# Patient Record
Sex: Female | Born: 1965
Health system: Southern US, Community
[De-identification: ages and names within clinical notes are randomized; demographics above are authoritative.]

## PROBLEM LIST (undated history)

## (undated) DIAGNOSIS — M722 Plantar fascial fibromatosis: Secondary | ICD-10-CM

## (undated) DIAGNOSIS — N83209 Unspecified ovarian cyst, unspecified side: Secondary | ICD-10-CM

## (undated) DIAGNOSIS — N809 Endometriosis, unspecified: Secondary | ICD-10-CM

## (undated) HISTORY — DX: Endometriosis, unspecified: N80.9

## (undated) HISTORY — DX: Unspecified ovarian cyst, unspecified side: N83.209

## (undated) HISTORY — DX: Plantar fascial fibromatosis: M72.2

## (undated) HISTORY — PX: TUBAL LIGATION: SHX77

## (undated) HISTORY — PX: EYE SURGERY: SHX253

---

## 2002-05-14 ENCOUNTER — Ambulatory Visit (HOSPITAL_COMMUNITY): Admission: RE | Admit: 2002-05-14 | Discharge: 2002-05-14 | Payer: Self-pay | Admitting: Family Medicine

## 2002-05-14 ENCOUNTER — Encounter: Payer: Self-pay | Admitting: Family Medicine

## 2002-12-15 ENCOUNTER — Other Ambulatory Visit: Admission: RE | Admit: 2002-12-15 | Discharge: 2002-12-15 | Payer: Self-pay | Admitting: Family Medicine

## 2002-12-15 ENCOUNTER — Other Ambulatory Visit: Admission: RE | Admit: 2002-12-15 | Discharge: 2002-12-15 | Payer: Self-pay | Admitting: *Deleted

## 2003-01-04 ENCOUNTER — Other Ambulatory Visit: Admission: RE | Admit: 2003-01-04 | Discharge: 2003-01-04 | Payer: Self-pay | Admitting: Obstetrics & Gynecology

## 2003-01-22 ENCOUNTER — Ambulatory Visit (HOSPITAL_COMMUNITY): Admission: RE | Admit: 2003-01-22 | Discharge: 2003-01-22 | Payer: Self-pay | Admitting: Obstetrics & Gynecology

## 2007-02-10 ENCOUNTER — Other Ambulatory Visit: Admission: RE | Admit: 2007-02-10 | Discharge: 2007-02-10 | Payer: Self-pay | Admitting: Obstetrics & Gynecology

## 2008-02-18 ENCOUNTER — Other Ambulatory Visit: Admission: RE | Admit: 2008-02-18 | Discharge: 2008-02-18 | Payer: Self-pay | Admitting: Obstetrics & Gynecology

## 2009-03-04 ENCOUNTER — Ambulatory Visit (HOSPITAL_COMMUNITY): Admission: RE | Admit: 2009-03-04 | Discharge: 2009-03-04 | Payer: Self-pay | Admitting: Obstetrics & Gynecology

## 2009-03-04 IMAGING — MG MM DIGITAL SCREENING
5 series · 5 of 5 positions shown · non-contrast
Comparison: none

DG SCREEN MAMMOGRAM BILATERAL
Bilateral CC and MLO view(s) were taken.

DIGITAL SCREENING MAMMOGRAM WITH CAD:
There are scattered fibroglandular densities.  No masses or malignant type calcifications are 
identified.
Images were processed with CAD.

[L CC]
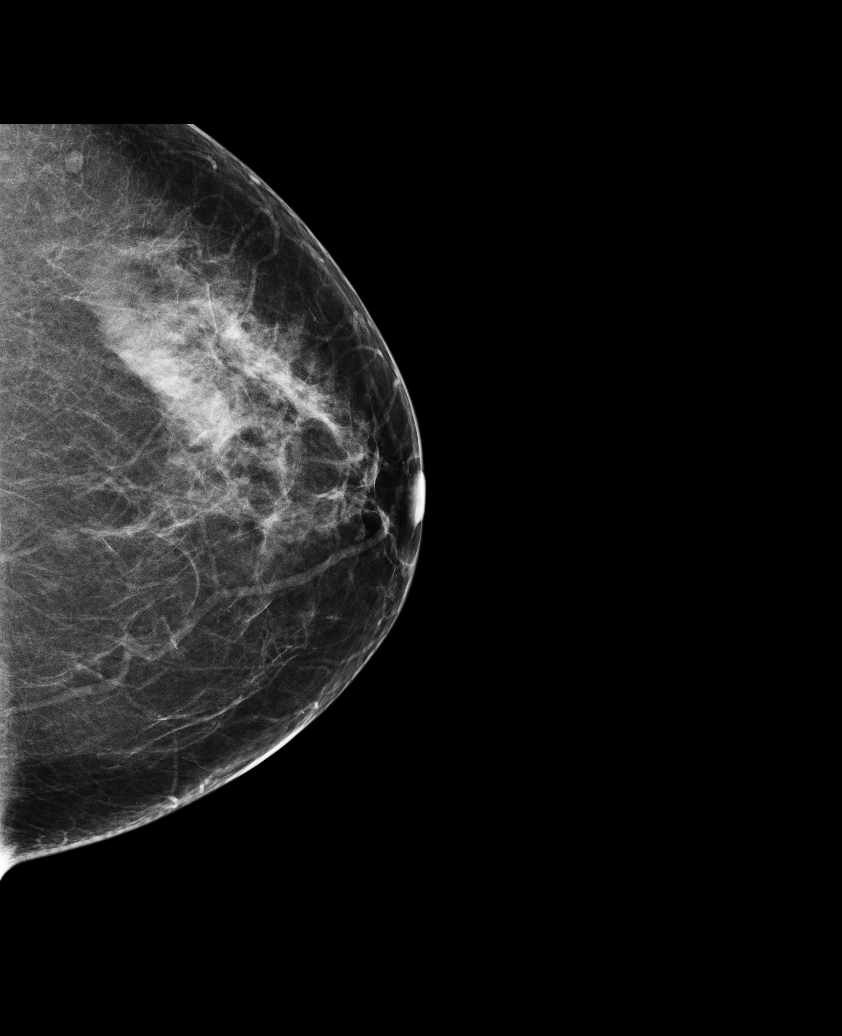

[L MLO]
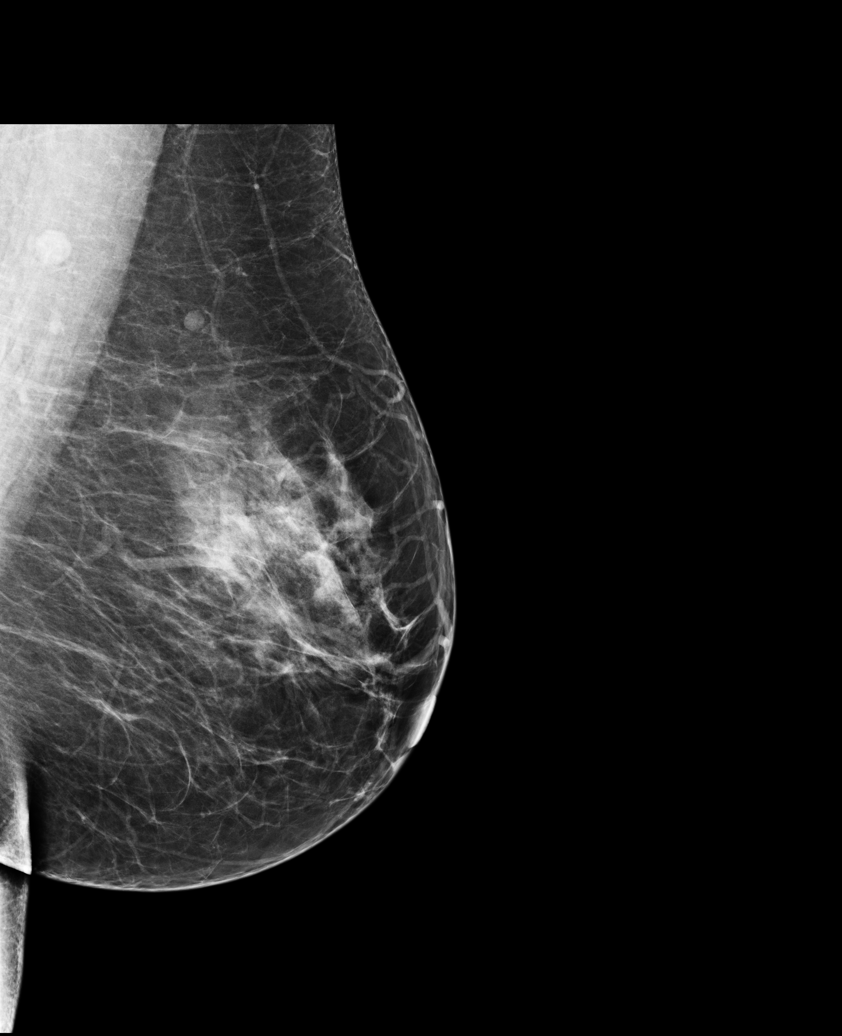

[R CC]
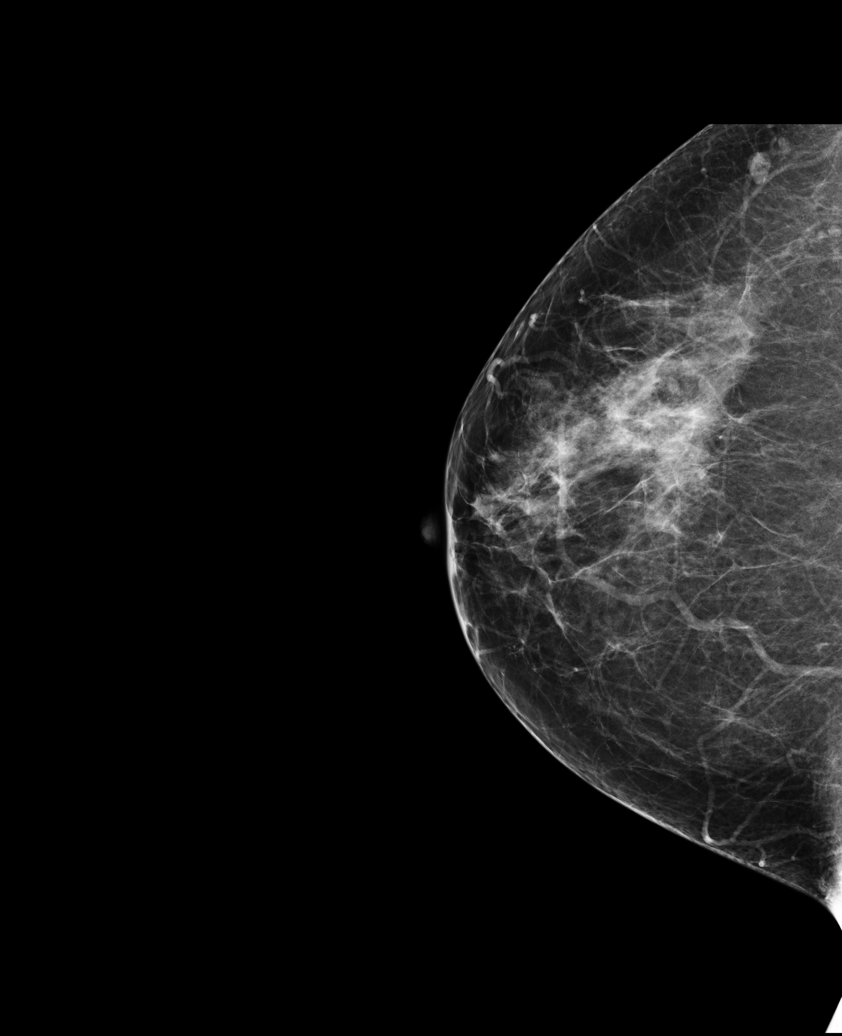

[R MLO (1 of 2)]
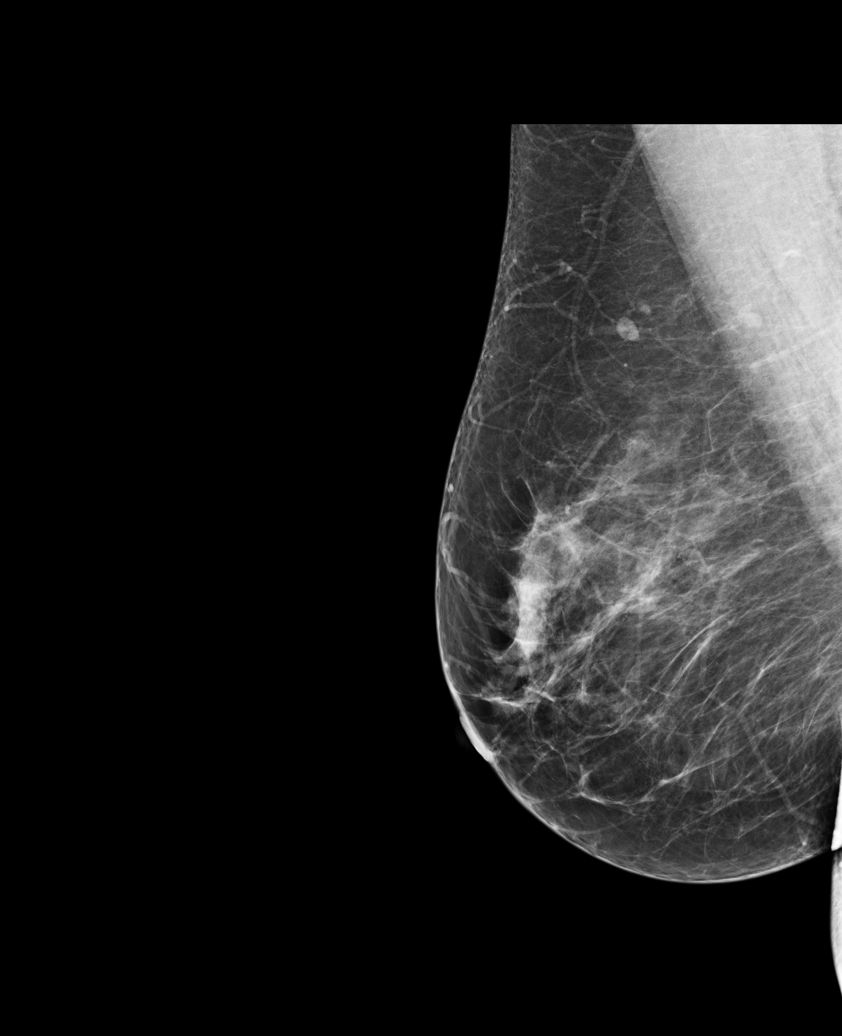

[R MLO (2 of 2)]
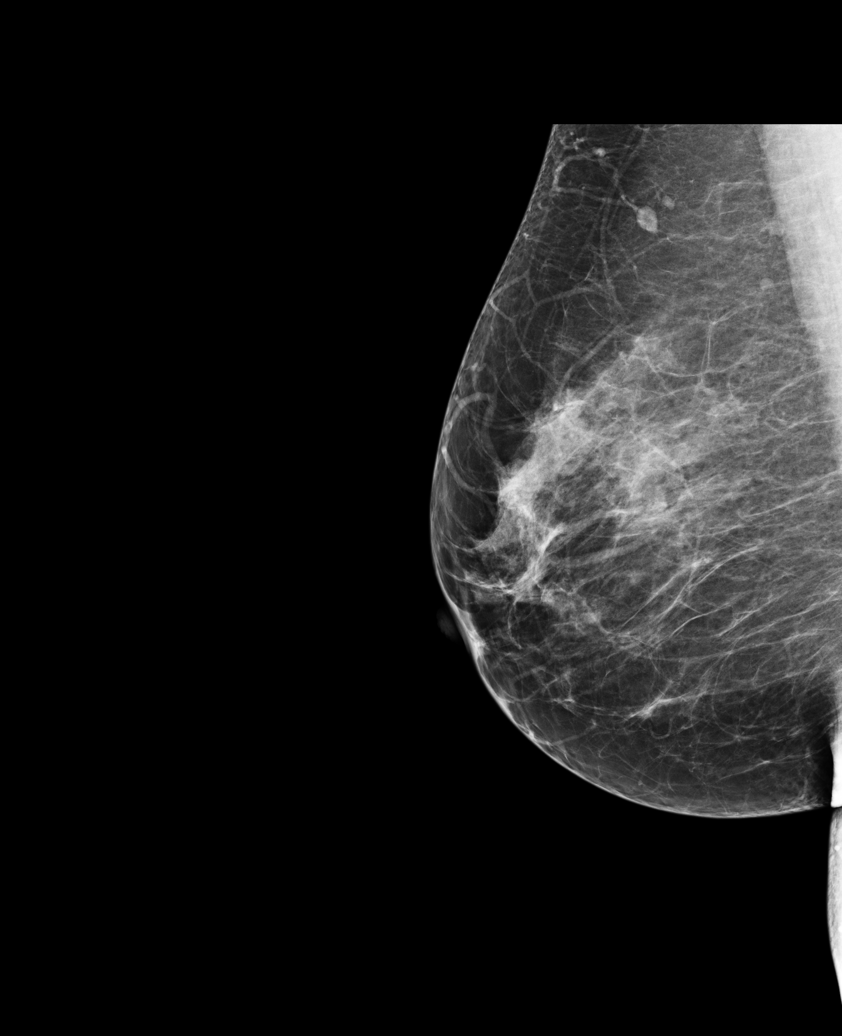

[5 of 5 positions shown; findings below may reference images not displayed]

IMPRESSION: No specific mammographic evidence of malignancy.  Next screening mammogram is recommended in one 
year.

A result letter of this screening mammogram will be mailed directly to the patient.

ASSESSMENT: Negative - BI-RADS 1

Screening mammogram in 1 year.
,

## 2009-03-24 ENCOUNTER — Other Ambulatory Visit: Admission: RE | Admit: 2009-03-24 | Discharge: 2009-03-24 | Payer: Self-pay | Admitting: Obstetrics & Gynecology

## 2010-02-26 ENCOUNTER — Encounter: Payer: Self-pay | Admitting: Family Medicine

## 2010-04-06 ENCOUNTER — Other Ambulatory Visit (HOSPITAL_COMMUNITY)
Admission: RE | Admit: 2010-04-06 | Discharge: 2010-04-06 | Disposition: A | Discharge status: Home / Self Care | Payer: PRIVATE HEALTH INSURANCE | Source: Ambulatory Visit | Attending: Obstetrics & Gynecology | Admitting: Obstetrics & Gynecology

## 2010-04-06 ENCOUNTER — Other Ambulatory Visit: Payer: Self-pay | Admitting: Obstetrics & Gynecology

## 2010-04-06 DIAGNOSIS — Z01419 Encounter for gynecological examination (general) (routine) without abnormal findings: Secondary | ICD-10-CM | POA: Insufficient documentation

## 2010-06-23 NOTE — Op Note (Signed)
NAME:  Kim Reed, Kim Reed                        ACCOUNT NO.:  [id number]   MEDICAL RECORD NO.:  [id number]                   PATIENT TYPE:  AMB   LOCATION:  DAY                                  FACILITY:  [facility]   PHYSICIAN:  Lazaro Arms, M.D.                DATE OF BIRTH:  [birth year]   DATE OF PROCEDURE:  01/22/2003  DATE OF DISCHARGE:  01/22/2003                                 OPERATIVE REPORT   PREOPERATIVE DIAGNOSIS:  Recurrent cervical dysplasia, mild.   POSTOPERATIVE DIAGNOSIS:  Recurrent cervical dysplasia, mild.   PROCEDURE:  Laser ablation of the cervix.   SURGEON:  Lazaro Arms, M.D.   ANESTHESIA:  Laryngeal mask airway.   FINDINGS:  The patient had a colposcopic directed biopsy performed in the  office which returned as mild dysplasia. She has had this before, treated  conservatively in the past.  As a result she was admitted for laser ablation  of the cervix.  She had a rather large lesion as well.   DESCRIPTION OF OPERATION:  The patient was taken to the operating room and  placed in the supine position where she underwent laryngeal mask airway.  She was placed in the dorsal lithotomy position and prepped and draped in  the usual sterile fashion.  A speculum was placed.  A circ evacuator hooked  up.  A colposcopy was, once again, performed with the laser microscope.  Holmium laser was used.  A good margin around the dysplasia was obtained, a  couple of millimeters.  Then the tissue was ablated down to a depth of 5-7  mm peripherally and 7-9 mm centrally in a conical fashion.  Hemostasis was  achieved with the laser and Monsel.   The patient tolerated the procedure well.  She received a pericervical block  prior to the procedure.  She was awakened from anesthesia taken to the  recovery in good stable condition.  No blood loss.      ___________________________________________                                            Lazaro Arms, M.D.   Loraine Maple  D:   01/28/2003  T:  01/28/2003  Job:  [id number]

## 2011-02-28 ENCOUNTER — Encounter (HOSPITAL_COMMUNITY): Payer: Self-pay

## 2011-02-28 ENCOUNTER — Emergency Department (HOSPITAL_COMMUNITY)
Admission: EM | Admit: 2011-02-28 | Discharge: 2011-02-28 | Disposition: A | Discharge status: Home / Self Care | Payer: Worker's Compensation | Attending: Emergency Medicine | Admitting: Emergency Medicine

## 2011-02-28 ENCOUNTER — Inpatient Hospital Stay (HOSPITAL_COMMUNITY): Admission: AD | Admit: 2011-02-28 | Payer: Self-pay | Source: Ambulatory Visit

## 2011-02-28 ENCOUNTER — Emergency Department (HOSPITAL_COMMUNITY): Payer: Worker's Compensation

## 2011-02-28 DIAGNOSIS — S139XXA Sprain of joints and ligaments of unspecified parts of neck, initial encounter: Secondary | ICD-10-CM | POA: Insufficient documentation

## 2011-02-28 DIAGNOSIS — Y92410 Unspecified street and highway as the place of occurrence of the external cause: Secondary | ICD-10-CM | POA: Insufficient documentation

## 2011-02-28 DIAGNOSIS — S161XXA Strain of muscle, fascia and tendon at neck level, initial encounter: Secondary | ICD-10-CM
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
## 2011-02-28 MED ORDER — HYDROCODONE-ACETAMINOPHEN 5-325 MG PO TABS: ORAL_TABLET | ORAL | Status: AC

## 2011-02-28 MED ORDER — METHOCARBAMOL 500 MG PO TABS: ORAL_TABLET | ORAL | Status: DC
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
## 2011-03-05 NOTE — ED Provider Notes (Signed)
Medical screening examination/treatment/procedure(s) were performed by non-physician practitioner and as supervising physician I was immediately available for consultation/collaboration.   Eleyna Brugh, MD 03/05/11 0836 

## 2012-02-18 ENCOUNTER — Encounter (HOSPITAL_COMMUNITY): Payer: Self-pay

## 2012-02-18 ENCOUNTER — Emergency Department (HOSPITAL_COMMUNITY)
Admission: EM | Admit: 2012-02-18 | Discharge: 2012-02-18 | Disposition: A | Discharge status: Home / Self Care | Payer: 59 | Attending: Emergency Medicine | Admitting: Emergency Medicine

## 2012-02-18 DIAGNOSIS — IMO0001 Reserved for inherently not codable concepts without codable children: Secondary | ICD-10-CM | POA: Insufficient documentation

## 2012-02-18 DIAGNOSIS — Z87891 Personal history of nicotine dependence: Secondary | ICD-10-CM | POA: Insufficient documentation

## 2012-02-18 DIAGNOSIS — M79662 Pain in left lower leg: Secondary | ICD-10-CM

## 2012-02-18 DIAGNOSIS — M79609 Pain in unspecified limb: Secondary | ICD-10-CM | POA: Insufficient documentation

## 2012-02-18 MED ORDER — ENOXAPARIN SODIUM 60 MG/0.6ML ~~LOC~~ SOLN
94.0000 mg | Freq: Once | SUBCUTANEOUS | Status: AC
  Administered 2012-02-18: 90 mg via SUBCUTANEOUS

## 2012-02-18 MED ORDER — ENOXAPARIN SODIUM 100 MG/ML ~~LOC~~ SOLN
SUBCUTANEOUS | Status: AC
  Filled 2012-02-18: qty 1

## 2012-02-18 NOTE — ED Notes (Signed)
Pain in left calf after strenuous walk uphill, seen by DR Modesto Charon today and sent in for w/u for blood clot

## 2012-02-18 NOTE — ED Provider Notes (Signed)
History     CSN: [id number]  Arrival date & time 02/18/12  4098   First MD Initiated Contact with Kim Reed 02/18/12 1957      Chief Complaint  Kim Reed presents with  . Leg Pain    (Consider location/radiation/quality/duration/timing/severity/associated sxs/prior treatment) HPI Comments: Kim Reed c/o pain to her left lower leg for one week.  Describes the pain as "aching and sore to touch".  States the pain is worse at night and with weight bearing.  She denies known injury, but does report running up a hill prior to onset of her symptoms.  States that she was seen earlier today by her PMD, Dr. Modesto Charon, and sent to ER for evaluation for a blood clot to her leg.  She denies leg swelling or redness, pregnancy, smoking, recent surgery, birth control , or hx of previous DVT or PE.  She also denies shortness of breath or chest pain.    Kim Reed is a 47 y.o. female presenting with leg pain. The history is provided by the Kim Reed.  Leg Pain  Incident onset: one week ago. The injury mechanism is unknown. The pain is present in the left leg. The quality of the pain is described as aching. The pain is moderate. The pain has been constant since onset. Pertinent negatives include no numbness, no inability to bear weight, no loss of motion, no muscle weakness, no loss of sensation and no tingling. She reports no foreign bodies present. The symptoms are aggravated by activity, bearing weight and palpation. She has tried nothing for the symptoms. The treatment provided no relief.    History reviewed. No pertinent past medical history.  History reviewed. No pertinent past surgical history.  No family history on file.  History  Substance Use Topics  . Smoking status: Former Smoker    Quit date: 02/18/2008  . Smokeless tobacco: Not on file  . Alcohol Use: No    OB History    Grav Para Term Preterm Abortions TAB SAB Ect Mult Living                  Review of Systems  Constitutional: Negative for  fever and chills.  Respiratory: Negative for cough, chest tightness and shortness of breath.   Cardiovascular: Negative for chest pain, palpitations and leg swelling.  Genitourinary: Negative for dysuria and difficulty urinating.  Musculoskeletal: Positive for myalgias. Negative for back pain, joint swelling and arthralgias.  Skin: Negative for color change and wound.  Neurological: Negative for tingling, weakness and numbness.  Hematological: Negative for adenopathy. Bruises/bleeds easily.  All other systems reviewed and are negative.    Allergies  Review of Kim Reed's allergies indicates no known allergies.  Home Medications   Current Outpatient Rx  Name  Route  Sig  Dispense  Refill  . METHOCARBAMOL 500 MG PO TABS      Take two tabs po TID prn muscle spasms   42 tablet   0     BP 114/63  Pulse 86  Temp 98.1 F (36.7 C) (Oral)  Resp 20  Ht 5\' 2"  (1.575 m)  Wt 208 lb (94.348 kg)  BMI 38.04 kg/m2  SpO2 99%  LMP 01/29/2012  Physical Exam  Nursing note and vitals reviewed. Constitutional: She is oriented to person, place, and time. She appears well-developed and well-nourished. No distress.  HENT:  Head: Normocephalic and atraumatic.  Cardiovascular: Normal rate, regular rhythm, normal heart sounds and intact distal pulses.   No murmur heard. Pulmonary/Chest: Effort normal and breath sounds  normal. No respiratory distress. She exhibits no tenderness.  Musculoskeletal: She exhibits tenderness. She exhibits no edema.       Left lower leg: She exhibits tenderness. She exhibits no bony tenderness, no swelling, no edema, no deformity and no laceration.       Legs:      ttp of the medial and posterior left calf.  No edema, erythema or varicosities.  Dp pulse is brisk, distal sensation intact.    Neurological: She is alert and oriented to person, place, and time. She exhibits normal muscle tone. Coordination normal.  Skin: Skin is warm and dry.    ED Course  Procedures  (including critical care time)  Labs Reviewed - No data to display No results found.      MDM    Kim Reed has ttp of the medial and posterior left lower leg for one week.  Unable to obtain US tonight.  I have scheduled her for appt tomorrow morning (02/19/12) at 7:30 am for DVT study.  Wells DVT score is low risk group for DVT.    Lovenox  given in ED.   She agrees to elevate, ibuprofen, minimize walking and close f/u with Dr. Modesto Charon at [facility].        Zacharey Jensen L. Briahna Pescador, PA 02/18/12 2057  Husam Hohn L. [place], [place] 02/18/12 2109

## 2012-02-18 NOTE — ED Provider Notes (Signed)
Medical screening examination/treatment/procedure(s) were performed by non-physician practitioner and as supervising physician I was immediately available for consultation/collaboration.   Benny Lennert, MD 02/18/12 2218

## 2012-02-19 ENCOUNTER — Ambulatory Visit (HOSPITAL_COMMUNITY)
Admission: RE | Admit: 2012-02-19 | Discharge: 2012-02-19 | Disposition: A | Discharge status: Home / Self Care | Payer: 59 | Source: Ambulatory Visit | Attending: Emergency Medicine | Admitting: Emergency Medicine

## 2012-02-19 DIAGNOSIS — M79609 Pain in unspecified limb: Secondary | ICD-10-CM | POA: Insufficient documentation

## 2012-02-19 IMAGING — US US EXTREM LOW VENOUS*L*
1 series · 14 of 24 positions shown · non-contrast
Comparison: None.

CLINICAL DATA: Left calf pain

LEFT LOWER EXTREMITY VENOUS DUPLEX ULTRASOUND
TECHNIQUE: Gray-scale sonography with graded compression, as well
as color Doppler and duplex ultrasound, were performed to evaluate
the deep venous system of the lower extremity from the level of the
common femoral vein through the popliteal and proximal calf veins.
Spectral Doppler was utilized to evaluate flow at rest and with
distal augmentation maneuvers.

[Series 1: us extrem low venous*left* · 0.07mm/px · 14 of 31 slices shown]
[im 1/31]
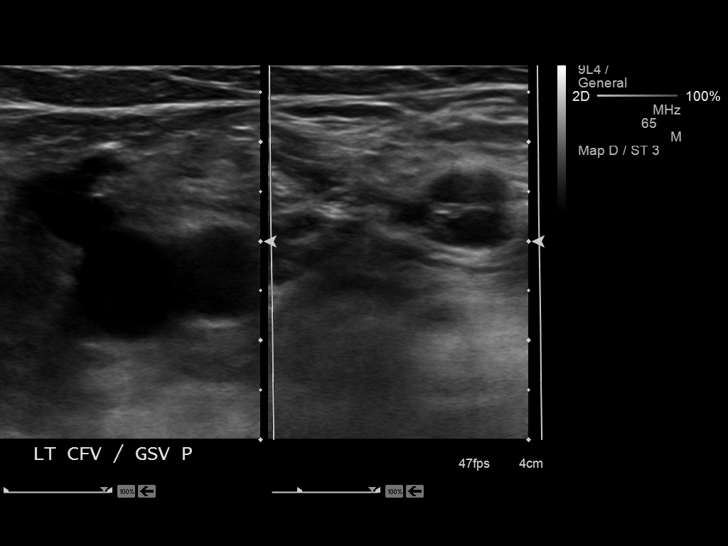
[im 3/31]
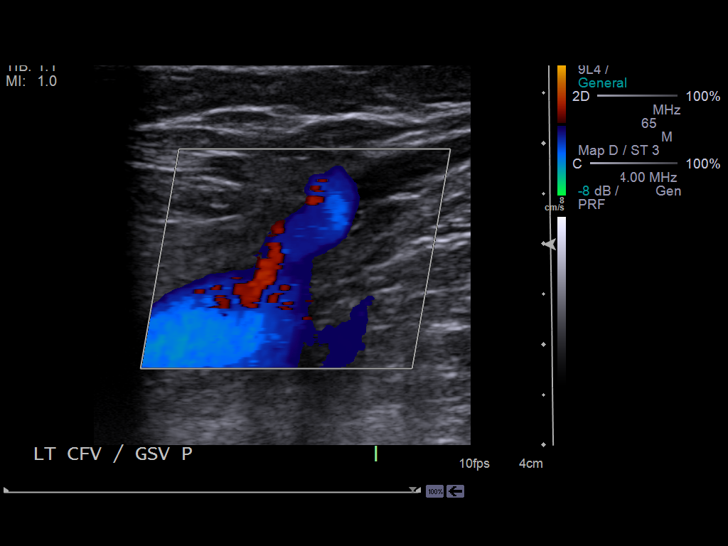
[im 6/31]
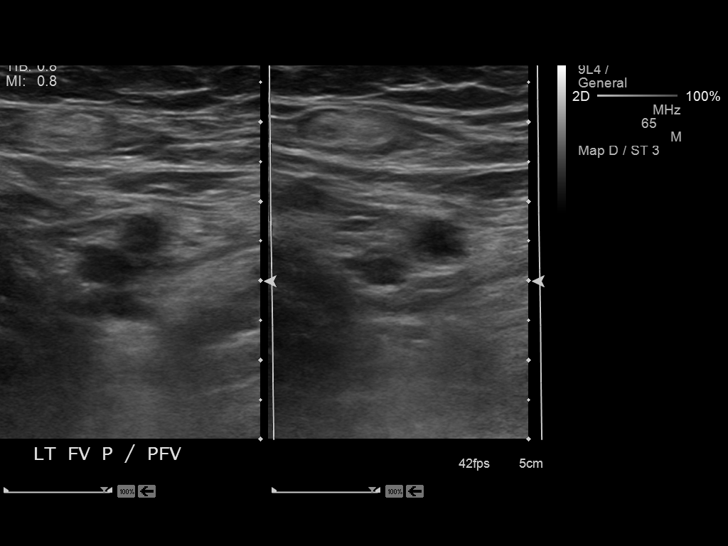
[im 8/31]
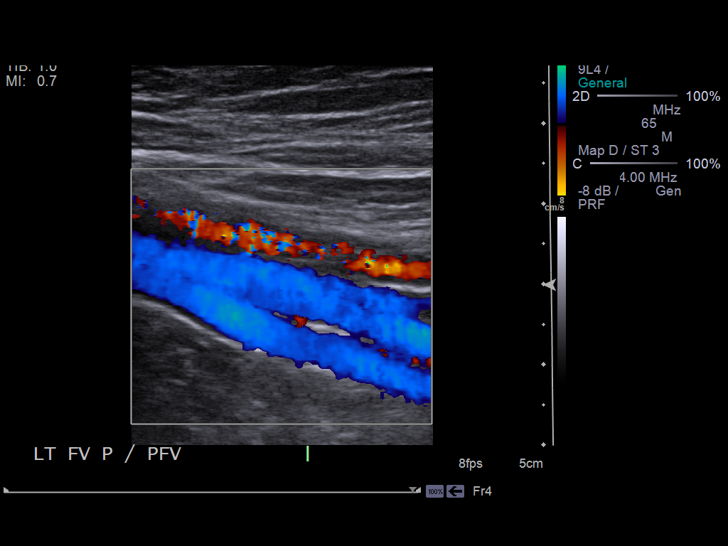
[im 10/31]
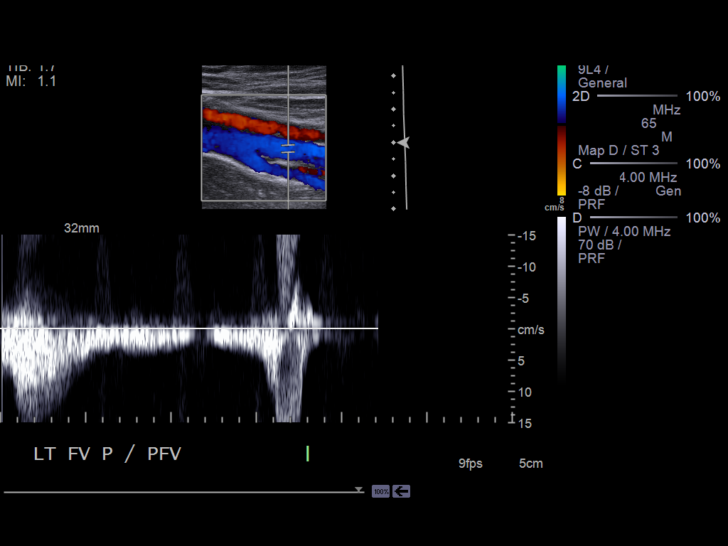
[im 12/31]
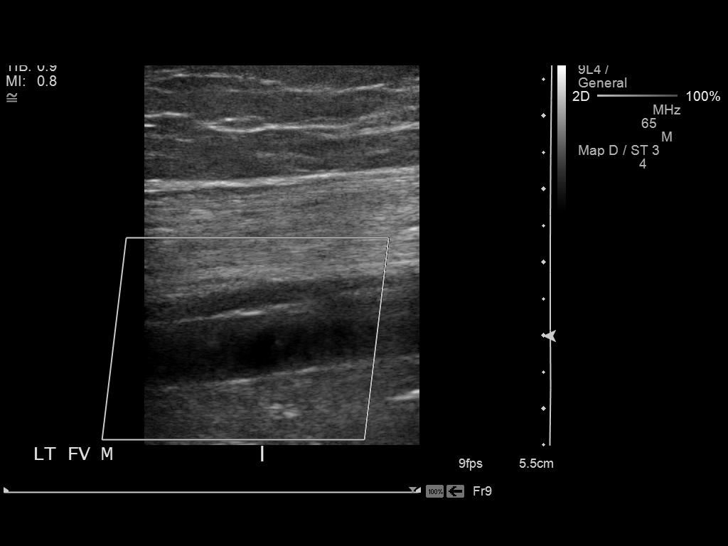
[im 15/31]
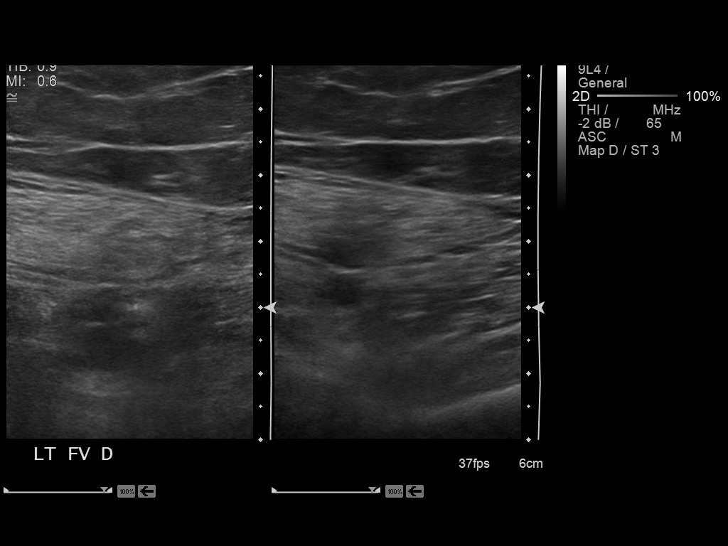
[im 16/31]
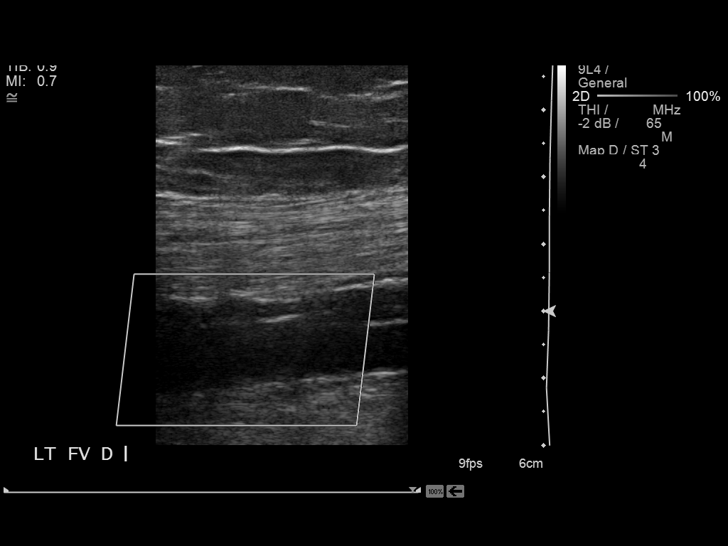
[im 19/31]
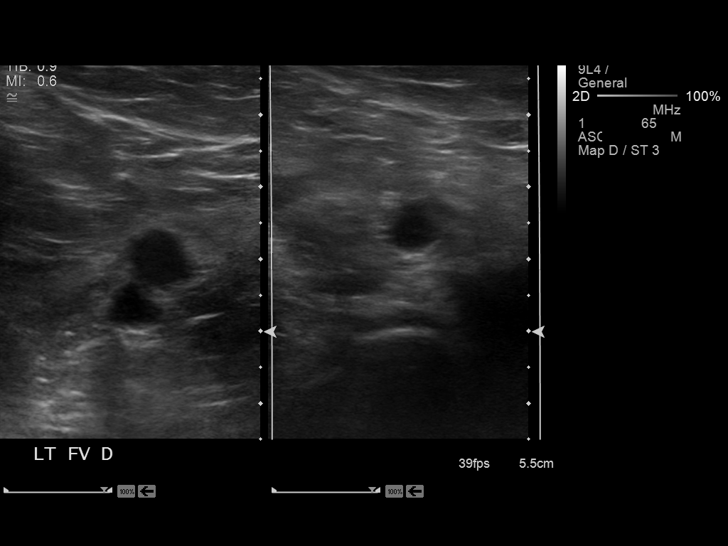
[im 21/31]
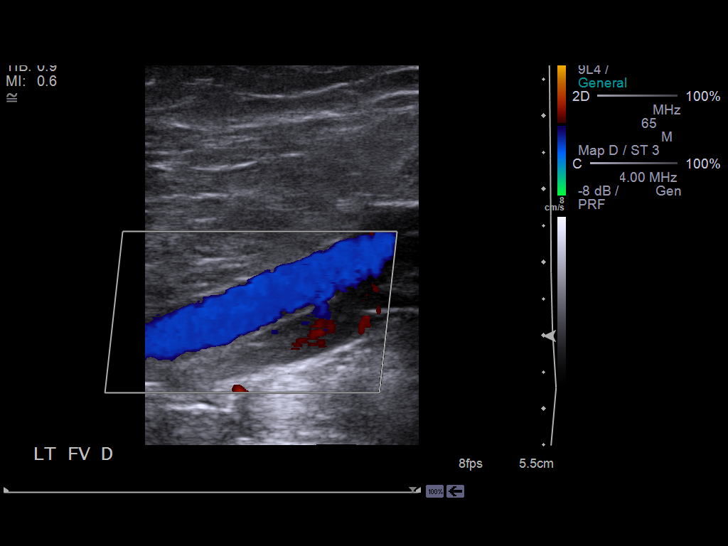
[im 24/31]
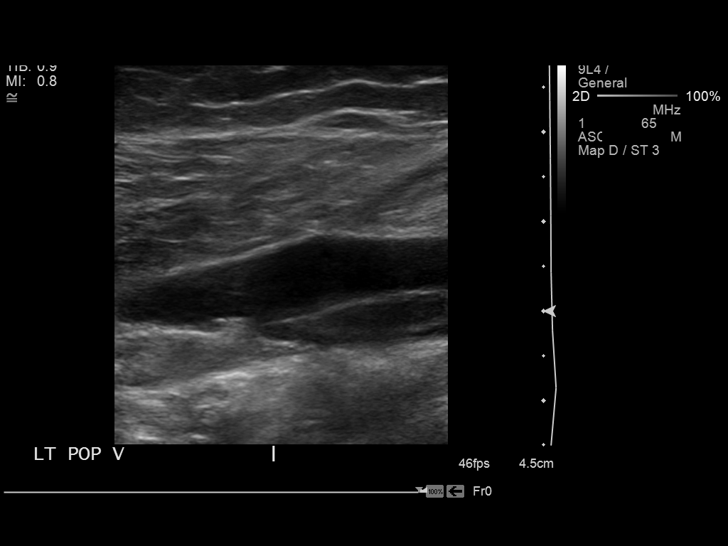
[im 25/31]
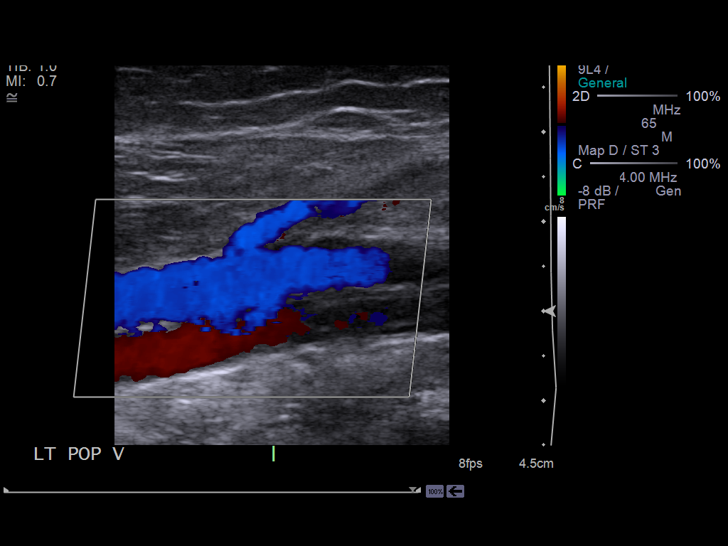
[im 28/31]
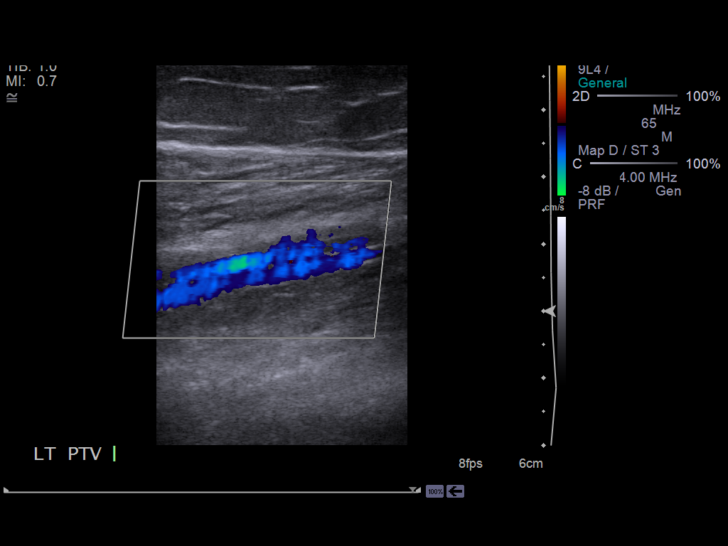
[im 31/31]
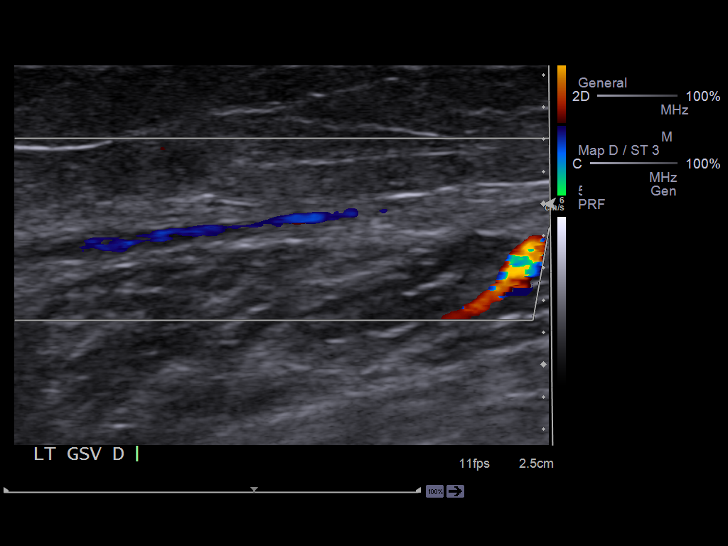

[14 of 24 positions shown; findings below may reference images not displayed]

FINDINGS: The visualized left lower extremity deep venous system
appears patent.

Normal compressibility.  Patent color Doppler flow.  Satisfactory
spectral Doppler with respiratory variation and response to
augmentation.
IMPRESSION: No deep venous thrombosis in the visualized left lower extremity.

## 2012-05-13 ENCOUNTER — Telehealth: Payer: Self-pay | Admitting: Obstetrics and Gynecology

## 2012-05-13 NOTE — Telephone Encounter (Signed)
Pt aware of WNL pap smear, Requesting a copy. Front office staff to mail copy of Pap to pt.

## 2012-10-02 ENCOUNTER — Ambulatory Visit (INDEPENDENT_AMBULATORY_CARE_PROVIDER_SITE_OTHER): Payer: 59 | Admitting: Family Medicine

## 2012-10-02 ENCOUNTER — Encounter: Payer: Self-pay | Admitting: Family Medicine

## 2012-10-02 VITALS — BP 104/67 | HR 57 | Temp 97.1°F | Ht 62.5 in | Wt 194.6 lb

## 2012-10-02 DIAGNOSIS — Z0000 Encounter for general adult medical examination without abnormal findings: Secondary | ICD-10-CM

## 2012-10-02 LAB — POCT CBC
Granulocyte percent: 69.8 %G (ref 37–80)
HCT, POC: 39.2 % (ref 37.7–47.9)
Hemoglobin: 13.3 g/dL (ref 12.2–16.2)
Lymph, poc: 2.2 (ref 0.6–3.4)
MCH, POC: 31.4 pg — AB (ref 27–31.2)
MCHC: 34 g/dL (ref 31.8–35.4)
MCV: 92.4 fL (ref 80–97)
MPV: 7.5 fL (ref 0–99.8)
POC Granulocyte: 6.7 (ref 2–6.9)
POC LYMPH PERCENT: 22.7 %L (ref 10–50)
Platelet Count, POC: 284 10*3/uL (ref 142–424)
RBC: 4.2 M/uL (ref 4.04–5.48)
RDW, POC: 12.3 %
WBC: 9.6 10*3/uL (ref 4.6–10.2)

## 2012-10-02 NOTE — Patient Instructions (Signed)
Calorie Counting Diet A calorie counting diet requires you to eat the number of calories that are right for you in a day. Calories are the measurement of how much energy you get from the food you eat. Eating the right amount of calories is important for staying at a healthy weight. If you eat too many calories, your body will store them as fat and you may gain weight. If you eat too few calories, you may lose weight. Counting the number of calories you eat during a day will help you know if you are eating the right amount. A Registered Dietitian can determine how many calories you need in a day. The amount of calories needed varies from person to person. If your goal is to lose weight, you will need to eat fewer calories. Losing weight can benefit you if you are overweight or have health problems such as heart disease, high blood pressure, or diabetes. If your goal is to gain weight, you will need to eat more calories. Gaining weight may be necessary if you have a certain health problem that causes your body to need more energy. TIPS Whether you are increasing or decreasing the number of calories you eat during a day, it may be hard to get used to changes in what you eat and drink. The following are tips to help you keep track of the number of calories you eat.  Measure foods at home with measuring cups. This helps you know the amount of food and number of calories you are eating.  Restaurants often serve food in amounts that are larger than 1 serving. While eating out, estimate how many servings of a food you are given. For example, a serving of cooked rice is  cup or about the size of half of a fist. Knowing serving sizes will help you be aware of how much food you are eating at restaurants.  Ask for smaller portion sizes or child-size portions at restaurants.  Plan to eat half of a meal at a restaurant. Take the rest home or share the other half with a friend.  Read the Nutrition Facts panel on  food labels for calorie content and serving size. You can find out how many servings are in a package, the size of a serving, and the number of calories each serving has.  For example, a package might contain 3 cookies. The Nutrition Facts panel on that package says that 1 serving is 1 cookie. Below that, it will say there are 3 servings in the container. The calories section of the Nutrition Facts label says there are 90 calories. This means there are 90 calories in 1 cookie (1 serving). If you eat 1 cookie you have eaten 90 calories. If you eat all 3 cookies, you have eaten 270 calories (3 servings x 90 calories = 270 calories). The list below tells you how big or small some common portion sizes are.  1 oz.........4 stacked dice.  3 oz.........Deck of cards.  1 tsp........Tip of little finger.  1 tbs........Thumb.  2 tbs........Golf ball.   cup.......Half of a fist.  1 cup........A fist. KEEP A FOOD LOG Write down every food item you eat, the amount you eat, and the number of calories in each food you eat during the day. At the end of the day, you can add up the total number of calories you have eaten. It may help to keep a list like the one below. Find out the calorie information by reading the   Nutrition Facts panel on food labels. Breakfast  Bran cereal (1 cup, 110 calories).  Fat-free milk ( cup, 45 calories). Snack  Apple (1 medium, 80 calories). Lunch  Spinach (1 cup, 20 calories).  Tomato ( medium, 20 calories).  Chicken breast strips (3 oz, 165 calories).  Shredded cheddar cheese ( cup, 110 calories).  Light Italian dressing (2 tbs, 60 calories).  Whole-wheat bread (1 slice, 80 calories).  Tub margarine (1 tsp, 35 calories).  Vegetable soup (1 cup, 160 calories). Dinner  Pork chop (3 oz, 190 calories).  Brown rice (1 cup, 215 calories).  Steamed broccoli ( cup, 20 calories).  Strawberries (1  cup, 65 calories).  Whipped cream (1 tbs, 50  calories). Daily Calorie Total: 1425 Document Released: 01/22/2005 Document Revised: 04/16/2011 Document Reviewed: 07/19/2006 [employer] Patient Information 2014 [employer].  

## 2012-10-02 NOTE — Progress Notes (Signed)
  Subjective:    Patient ID: Kim Reed, female    DOB: 02-Jan-1966, 47 y.o.   MRN: [id number]  HPI This 47 y.o. female presents for evaluation of CPE.  She brings in forms from her work. She has hx of hematoma right knee and she is seeing orthopedics.  She has hx of obesity. She sees OBGYN and is up to date with her mammo and pap smear.  She has no acute complaints.   Review of Systems No chest pain, SOB, HA, dizziness, vision change, N/V, diarrhea, constipation, dysuria, urinary urgency or frequency, myalgias, arthralgias or rash.     Objective:   Physical Exam Vital signs noted  Well developed well nourished female.  HEENT - Head atraumatic Normocephalic                Eyes - PERRLA, Conjuctiva - clear Sclera- Clear EOMI                Ears - EAC's Wnl TM's Wnl Gross Hearing WNL                Nose - Nares patent                 Throat - oropharanx wnl Respiratory - Lungs CTA bilateral Cardiac - RRR S1 and S2 without murmur GI - Abdomen soft Nontender and bowel sounds active x 4 Extremities - No edema. Neuro - Grossly intact.       Assessment & Plan:  Routine general medical examination at a health care facility - Plan: POCT CBC, CMP14+EGFR, Lipid panel, Thyroid Panel With TSH She can follow up annually for CPE and follow up with OBGYN for pap and mammogram.  She has had dental exam And eye exam this year.  Discussed getting labs back and fill out form for her employer.

## 2012-10-03 ENCOUNTER — Other Ambulatory Visit: Payer: Self-pay | Admitting: Family Medicine

## 2012-10-03 DIAGNOSIS — E875 Hyperkalemia: Secondary | ICD-10-CM

## 2012-10-03 LAB — LIPID PANEL
Chol/HDL Ratio: 3.8 ratio units (ref 0.0–4.4)
Cholesterol, Total: 177 mg/dL (ref 100–199)
HDL: 47 mg/dL (ref >39)
LDL Calculated: 107 mg/dL — ABNORMAL HIGH (ref 0–99)
Triglycerides: 113 mg/dL (ref 0–149)
VLDL Cholesterol Cal: 23 mg/dL (ref 5–40)

## 2012-10-03 LAB — CMP14+EGFR
ALT: 8 IU/L (ref 0–32)
AST: 10 IU/L (ref 0–40)
Albumin/Globulin Ratio: 1.5 (ref 1.1–2.5)
Albumin: 4.1 g/dL (ref 3.5–5.5)
Alkaline Phosphatase: 88 IU/L (ref 39–117)
BUN/Creatinine Ratio: 10 (ref 9–23)
BUN: 6 mg/dL (ref 6–24)
CO2: 25 mmol/L (ref 18–29)
Calcium: 9.1 mg/dL (ref 8.7–10.2)
Chloride: 102 mmol/L (ref 97–108)
Creatinine, Ser: 0.62 mg/dL (ref 0.57–1.00)
GFR calc Af Amer: 126 mL/min/{1.73_m2} (ref >59)
GFR calc non Af Amer: 109 mL/min/{1.73_m2} (ref >59)
Globulin, Total: 2.7 g/dL (ref 1.5–4.5)
Glucose: 85 mg/dL (ref 65–99)
Potassium: 5.3 mmol/L — ABNORMAL HIGH (ref 3.5–5.2)
Sodium: 140 mmol/L (ref 134–144)
Total Bilirubin: 0.4 mg/dL (ref 0.0–1.2)
Total Protein: 6.8 g/dL (ref 6.0–8.5)

## 2012-10-03 LAB — THYROID PANEL WITH TSH
Free Thyroxine Index: 1.4 (ref 1.2–4.9)
T3 Uptake Ratio: 30 % (ref 24–39)
T4, Total: 4.7 ug/dL (ref 4.5–12.0)
TSH: 1.92 u[IU]/mL (ref 0.450–4.500)

## 2012-10-08 ENCOUNTER — Other Ambulatory Visit (INDEPENDENT_AMBULATORY_CARE_PROVIDER_SITE_OTHER): Payer: 59

## 2012-10-08 DIAGNOSIS — E875 Hyperkalemia: Secondary | ICD-10-CM

## 2012-10-08 NOTE — Progress Notes (Signed)
Patient came in for labs only.

## 2012-10-09 ENCOUNTER — Telehealth: Payer: Self-pay | Admitting: Family Medicine

## 2012-10-09 LAB — BMP8+EGFR
BUN/Creatinine Ratio: 14 (ref 9–23)
BUN: 10 mg/dL (ref 6–24)
CO2: 25 mmol/L (ref 18–29)
Calcium: 8.8 mg/dL (ref 8.7–10.2)
Chloride: 98 mmol/L (ref 97–108)
Creatinine, Ser: 0.7 mg/dL (ref 0.57–1.00)
GFR calc Af Amer: 121 mL/min/{1.73_m2} (ref >59)
GFR calc non Af Amer: 105 mL/min/{1.73_m2} (ref >59)
Glucose: 79 mg/dL (ref 65–99)
Potassium: 4.2 mmol/L (ref 3.5–5.2)
Sodium: 140 mmol/L (ref 134–144)

## 2012-10-09 NOTE — Telephone Encounter (Signed)
Please review labs. 

## 2012-10-14 ENCOUNTER — Ambulatory Visit (INDEPENDENT_AMBULATORY_CARE_PROVIDER_SITE_OTHER): Payer: 59 | Admitting: General Practice

## 2012-10-14 ENCOUNTER — Encounter: Payer: Self-pay | Admitting: General Practice

## 2012-10-14 VITALS — BP 92/58 | HR 66 | Temp 97.3°F | Ht 61.0 in | Wt 195.0 lb

## 2012-10-14 DIAGNOSIS — L0390 Cellulitis, unspecified: Secondary | ICD-10-CM

## 2012-10-14 DIAGNOSIS — L0291 Cutaneous abscess, unspecified: Secondary | ICD-10-CM

## 2012-10-14 DIAGNOSIS — W57XXXA Bitten or stung by nonvenomous insect and other nonvenomous arthropods, initial encounter: Secondary | ICD-10-CM

## 2012-10-14 DIAGNOSIS — T148 Other injury of unspecified body region: Secondary | ICD-10-CM

## 2012-10-14 MED ORDER — DOXYCYCLINE HYCLATE 100 MG PO TABS: ORAL_TABLET | ORAL | Status: DC

## 2012-10-14 NOTE — Progress Notes (Signed)
  Subjective:    Patient ID: Kim Reed, female    DOB: July 09, 1965, 47 y.o.   MRN: [id number]  HPI Patient presents today with complaints of tick bite. She reports removing a tick three days ago from right upper inner thigh and redness has worsened. She reports applying neosporin to area after tick removal. Reports area is itching. Reports last menstrual cycle was two weeks ago and cycle was normal.     Review of Systems  Constitutional: Negative for fever and chills.  Respiratory: Negative for cough, chest tightness and shortness of breath.   Cardiovascular: Negative for chest pain and palpitations.  Genitourinary: Negative for difficulty urinating.  Skin:       Redness noted to right upper thigh.  Neurological: Negative for dizziness, weakness and headaches.       Objective:   Physical Exam  Constitutional: She is oriented to person, place, and time. She appears well-developed and well-nourished.  HENT:  Head: Normocephalic and atraumatic.  Neck: Normal range of motion. Neck supple.  Cardiovascular: Normal rate, regular rhythm and normal heart sounds.   Pulmonary/Chest: Effort normal and breath sounds normal.  Neurological: She is alert and oriented to person, place, and time.  Skin: Skin is warm and dry.  Erythematous, warm to touch area noted to right upper inner thigh, measuring 2 inches x 2 inches, with pin point center oozing small amount of serous drainage.   Psychiatric: She has a normal mood and affect.          Assessment & Plan:  1. Tick bite - Rocky mtn spotted fvr abs pnl(IgG+IgM) - Lyme Ab/Western Blot Reflex - doxycycline (VIBRA-TABS) 100 MG tablet; Take one tablet twice a day on day one, then take one tablet daily for next 14 days  Dispense: 16 tablet; Refill: 0  2. Cellulitis -keep area clean and dry -Monitor for increased redness and warm (marked area with skin marker) -RTO if symptoms worsen or unresolved, may seek emergency medical  treatment -Patient verbalized understanding -Coralie Keens, FNP-C

## 2012-10-14 NOTE — Patient Instructions (Signed)
Deer Tick Bite Deer ticks are brown arachnids (spider family) that vary in size from as small as the head of a pin to 1/4 inch (1/2 cm) diameter. They thrive in wooded areas. Deer are the preferred host of adult deer ticks. Small rodents are the host of young ticks (nymphs). When a person walks in a field or wooded area, young and adult ticks in the surrounding grass and vegetation can attach themselves to the skin. They can suck blood for hours to days if unnoticed. Ticks are found all over the [place] Some ticks carry a specific bacteria (Borrelia burgdorferi) that causes an infection called Lyme disease. The bacteria is typically passed into a person during the blood sucking process. This happens after the tick has been attached for at least a number of hours. While ticks can be found all over the [place] those carrying the bacteria that causes Lyme disease are most common in [place] and the [place]. Only a small proportion of ticks in these areas carry the Lyme disease bacteria and cause human infections. Ticks usually attach to warm spots on the body, such as the:  Head.  Back.  Neck.  Armpits.  Groin. SYMPTOMS  Most of the time, a deer tick bite will not be felt. You may or may not see the attached tick. You may notice mild irritation or redness around the bite site. If the deer tick passes the Lyme disease bacteria to a person, a round, red rash may be noticed 2 to 3 days after the bite. The rash may be clear in the middle, like a bull's-eye or target. If not treated, other symptoms may develop several days to weeks after the onset of the rash. These symptoms may include:  New rash lesions.  Fatigue and weakness.  General ill feeling and achiness.  Chills.  Headache and neck pain.  Swollen lymph glands.  Sore muscles and joints. 5 to 15% of untreated people with Lyme disease may develop more severe illnesses after several weeks to months. This may include inflammation of the  brain lining (meningitis), nerve palsies, an abnormal heartbeat, or severe muscle and joint pain and inflammation (myositis or arthritis). DIAGNOSIS   Physical exam and medical history.  Viewing the tick if it was saved for confirmation.  Blood tests (to check or confirm the presence of Lyme disease). TREATMENT  Most ticks do not carry disease. If found, an attached tick should be removed using tweezers. Tweezers should be placed under the body of the tick so it is removed by its attachment parts (pincers). If there are signs or symptoms of being sick, or Lyme disease is confirmed, medicines (antibiotics) that kill germs are usually prescribed. In more severe cases, antibiotics may be given through an intravenous (IV) access. HOME CARE INSTRUCTIONS   Always remove ticks with tweezers. Do not use petroleum jelly or other methods to kill or remove the tick. Slide the tweezers under the body and pull out as much as you can. If you are not sure what it is, save it in a jar and show your caregiver.  Once you remove the tick, the skin will heal on its own. Wash your hands and the affected area with water and soap. You may place a bandage on the affected area.  Take medicine as directed. You may be advised to take a full course of antibiotics.  Follow up with your caregiver as recommended. FINDING OUT THE RESULTS OF YOUR TEST Not all test results are available   during your visit. If your test results are not back during the visit, make an appointment with your caregiver to find out the results. Do not assume everything is normal if you have not heard from your caregiver or the medical facility. It is important for you to follow up on all of your test results. PROGNOSIS  If Lyme disease is confirmed, early treatment with antibiotics is very effective. Following preventive guidelines is important since it is possible to get the disease more than once. PREVENTION   Wear long sleeves and long pants in  wooded or grassy areas. Tuck your pants into your socks.  Use an insect repellent while hiking.  Check yourself, your children, and your pets regularly for ticks after playing outside.  Clear piles of leaves or brush from your yard. Ticks might live there. SEEK MEDICAL CARE IF:   You or your child has an oral temperature above 102 F (38.9 C).  You develop a severe headache following the bite.  You feel generally ill.  You notice a rash.  You are having trouble removing the tick.  The bite area has red skin or yellow drainage. SEEK IMMEDIATE MEDICAL CARE IF:   Your face is weak and droopy or you have other neurological symptoms.  You have severe joint pain or weakness. MAKE SURE YOU:   Understand these instructions.  Will watch your condition.  Will get help right away if you are not doing well or get worse. FOR MORE INFORMATION Centers for Disease Control and Prevention: www.cdc.gov [employer]: www.aafp.org Document Released: 04/18/2009 Document Revised: 04/16/2011 Document Reviewed: 04/18/2009 [employer] Patient Information 2014 [employer].  

## 2012-10-15 ENCOUNTER — Telehealth: Payer: Self-pay | Admitting: General Practice

## 2012-10-16 LAB — LYME AB/WESTERN BLOT REFLEX
LYME DISEASE AB, QUANT, IGM: <0.8 index (ref 0.00–0.79)
Lyme IgG/IgM Ab: <0.91 {ISR} (ref 0.00–0.90)

## 2012-10-16 NOTE — Telephone Encounter (Signed)
Patient aware of labs.  

## 2013-05-04 ENCOUNTER — Telehealth: Payer: Self-pay | Admitting: Nurse Practitioner

## 2013-05-04 ENCOUNTER — Ambulatory Visit (INDEPENDENT_AMBULATORY_CARE_PROVIDER_SITE_OTHER): Payer: BC Managed Care – PPO | Admitting: Family Medicine

## 2013-05-04 ENCOUNTER — Encounter: Payer: Self-pay | Admitting: Family Medicine

## 2013-05-04 ENCOUNTER — Ambulatory Visit: Payer: 59 | Admitting: Family Medicine

## 2013-05-04 VITALS — BP 99/64 | HR 63 | Temp 97.5°F | Ht 61.0 in | Wt 200.0 lb

## 2013-05-04 DIAGNOSIS — R52 Pain, unspecified: Secondary | ICD-10-CM

## 2013-05-04 DIAGNOSIS — J069 Acute upper respiratory infection, unspecified: Secondary | ICD-10-CM

## 2013-05-04 DIAGNOSIS — J029 Acute pharyngitis, unspecified: Secondary | ICD-10-CM

## 2013-05-04 LAB — POCT INFLUENZA A/B
Influenza A, POC: NEGATIVE
Influenza B, POC: NEGATIVE

## 2013-05-04 LAB — POCT RAPID STREP A (OFFICE): Rapid Strep A Screen: NEGATIVE

## 2013-05-04 MED ORDER — AMOXICILLIN 500 MG PO CAPS: 500.0000 mg | ORAL_CAPSULE | Freq: Three times a day (TID) | ORAL | Status: DC

## 2013-05-04 NOTE — Progress Notes (Signed)
Subjective:    Patient ID: Kim Reed, female    DOB: [birth year], 48 y.o.   MRN: [id number]  HPI Patient here today for congestion, sore throat, ear pain and sinus pressure that started on Saturday.     There are no active problems to display for this patient.  Outpatient Encounter Prescriptions as of 05/04/2013  Medication Sig  . [DISCONTINUED] doxycycline (VIBRA-TABS) 100 MG tablet Take one tablet twice a day on day one, then take one tablet daily for next 14 days  . [DISCONTINUED] methocarbamol (ROBAXIN) 500 MG tablet Take two tabs po TID prn muscle spasms    Review of Systems  Constitutional: Positive for fever (unknown) and chills.  HENT: Positive for congestion (head), ear pain and sinus pressure.   Eyes: Negative.   Respiratory: Negative.  Negative for cough.   Cardiovascular: Negative.   Gastrointestinal: Negative.   Endocrine: Negative.   Genitourinary: Negative.   Musculoskeletal: Positive for myalgias.  Skin: Negative.   Allergic/Immunologic: Negative.   Neurological: Positive for dizziness. Negative for headaches.  Hematological: Negative.   Psychiatric/Behavioral: Negative.        Objective:   Physical Exam  Nursing note and vitals reviewed. Constitutional: She appears well-developed and well-nourished. She appears distressed.  HENT:  Head: Normocephalic and atraumatic.  Right Ear: External ear normal.  Nose: Nose normal.  Mouth/Throat: Oropharynx is clear and moist. No oropharyngeal exudate.  Ear cerumen left external auditory canal  Eyes: Conjunctivae and EOM are normal. Pupils are equal, round, and reactive to light. Right eye exhibits no discharge. Left eye exhibits no discharge. No scleral icterus.  Neck: Normal range of motion. Neck supple. No JVD present. No thyromegaly present.  Cardiovascular: Normal rate, regular rhythm, normal heart sounds and intact distal pulses.  Exam reveals no gallop and no friction rub.   No murmur  heard. Pulmonary/Chest: Effort normal and breath sounds normal. No respiratory distress. She has no wheezes. She has no rales. She exhibits no tenderness.  Abdominal: Soft. Bowel sounds are normal.  Musculoskeletal: Normal range of motion.  Lymphadenopathy:    She has cervical adenopathy (anterior cervical tenderness and adenopathy left greater than right).  Neurological: She is alert.  Skin: Skin is warm and dry. No rash noted.  Psychiatric: She has a normal mood and affect. Her behavior is normal. Judgment and thought content normal.  Malaise   BP 99/64  Pulse 63  Temp(Src) 97.5 F (36.4 C) (Oral)  Ht 5\' 1"  (1.549 m)  Wt 200 lb (90.719 kg)  BMI 37.81 kg/m2  LMP 04/13/2013  Results for orders placed in visit on 05/04/13  POCT RAPID STREP A (OFFICE)      Result Value Ref Range   Rapid Strep A Screen Negative  Negative  POCT INFLUENZA A/B      Result Value Ref Range   Influenza A, POC Negative     Influenza B, POC Negative            Assessment & Plan:  1. Sore throat - POCT rapid strep A - POCT Influenza A/B - Strep A culture, throat - amoxicillin (AMOXIL) 500 MG capsule; Take 1 capsule (500 mg total) by mouth 3 (three) times daily.  Dispense: 30 capsule; Refill: 0  2. Body aches - POCT Influenza A/B - amoxicillin (AMOXIL) 500 MG capsule; Take 1 capsule (500 mg total) by mouth 3 (three) times daily.  Dispense: 30 capsule; Refill: 0  3. URI (upper respiratory infection) - amoxicillin (AMOXIL) 500 MG capsule;  Take 1 capsule (500 mg total) by mouth 3 (three) times daily.  Dispense: 30 capsule; Refill: 0  Patient Instructions  Drink plenty of fluids Use saline nose spray frequently Gargle with warm salted water Take Tylenol or Advil as needed for aches pains and fever Take antibiotic as directed He may continue to take Sudafed and add Claritin to this   Arrie Senate MD

## 2013-05-04 NOTE — Patient Instructions (Signed)
Drink plenty of fluids Use saline nose spray frequently Gargle with warm salted water Take Tylenol or Advil as needed for aches pains and fever Take antibiotic as directed He may continue to take Sudafed and add Claritin to this

## 2013-05-04 NOTE — Telephone Encounter (Signed)
appt 4:45 with moore

## 2013-05-06 ENCOUNTER — Telehealth: Payer: Self-pay | Admitting: Family Medicine

## 2013-05-06 LAB — STREP A CULTURE, THROAT: STREP A CULTURE: NEGATIVE

## 2013-05-06 NOTE — Telephone Encounter (Signed)
Pt aware that throat culture gas not resulted at this time

## 2013-05-07 ENCOUNTER — Telehealth: Payer: Self-pay | Admitting: Family Medicine

## 2013-05-07 NOTE — Telephone Encounter (Signed)
Discussed results with patient

## 2013-12-08 ENCOUNTER — Ambulatory Visit (INDEPENDENT_AMBULATORY_CARE_PROVIDER_SITE_OTHER): Payer: BC Managed Care – PPO | Admitting: Obstetrics & Gynecology

## 2013-12-08 ENCOUNTER — Encounter: Payer: Self-pay | Admitting: Obstetrics & Gynecology

## 2013-12-08 ENCOUNTER — Other Ambulatory Visit (HOSPITAL_COMMUNITY)
Admission: RE | Admit: 2013-12-08 | Discharge: 2013-12-08 | Disposition: A | Discharge status: Home / Self Care | Payer: BC Managed Care – PPO | Source: Ambulatory Visit | Attending: Obstetrics & Gynecology | Admitting: Obstetrics & Gynecology

## 2013-12-08 VITALS — BP 110/62 | Ht 63.0 in | Wt 209.0 lb

## 2013-12-08 DIAGNOSIS — Z01419 Encounter for gynecological examination (general) (routine) without abnormal findings: Secondary | ICD-10-CM

## 2013-12-08 DIAGNOSIS — Z1151 Encounter for screening for human papillomavirus (HPV): Secondary | ICD-10-CM | POA: Insufficient documentation

## 2013-12-08 DIAGNOSIS — N921 Excessive and frequent menstruation with irregular cycle: Secondary | ICD-10-CM

## 2013-12-08 MED ORDER — MEGESTROL ACETATE 40 MG PO TABS: 40.0000 mg | ORAL_TABLET | Freq: Every day | ORAL | Status: DC

## 2013-12-09 ENCOUNTER — Telehealth: Payer: Self-pay | Admitting: *Deleted

## 2013-12-09 NOTE — Telephone Encounter (Signed)
Pt states saw were Amoxicillin was on her AVS was she suppose to be taken. Pt informed Amoxicillin was prescribed in March by a Dr. Laurance Flatten. Pt verbalized understanding.

## 2013-12-11 LAB — CYTOLOGY - PAP

## 2013-12-16 ENCOUNTER — Ambulatory Visit: Payer: BC Managed Care – PPO | Attending: Specialist | Admitting: Physical Therapy

## 2013-12-16 DIAGNOSIS — M25561 Pain in right knee: Secondary | ICD-10-CM | POA: Diagnosis not present

## 2013-12-16 DIAGNOSIS — Z91040 Latex allergy status: Secondary | ICD-10-CM | POA: Insufficient documentation

## 2013-12-16 DIAGNOSIS — Z5189 Encounter for other specified aftercare: Secondary | ICD-10-CM | POA: Insufficient documentation

## 2013-12-16 DIAGNOSIS — Z91030 Bee allergy status: Secondary | ICD-10-CM | POA: Diagnosis not present

## 2013-12-16 DIAGNOSIS — M1712 Unilateral primary osteoarthritis, left knee: Secondary | ICD-10-CM | POA: Insufficient documentation

## 2013-12-17 ENCOUNTER — Other Ambulatory Visit: Payer: Self-pay | Admitting: Obstetrics & Gynecology

## 2013-12-17 DIAGNOSIS — Z139 Encounter for screening, unspecified: Secondary | ICD-10-CM

## 2013-12-18 ENCOUNTER — Ambulatory Visit: Payer: BC Managed Care – PPO | Admitting: Physical Therapy

## 2013-12-18 DIAGNOSIS — Z5189 Encounter for other specified aftercare: Secondary | ICD-10-CM | POA: Diagnosis not present

## 2013-12-21 ENCOUNTER — Ambulatory Visit: Payer: BC Managed Care – PPO | Admitting: Physical Therapy

## 2013-12-21 DIAGNOSIS — Z5189 Encounter for other specified aftercare: Secondary | ICD-10-CM | POA: Diagnosis not present

## 2013-12-25 ENCOUNTER — Ambulatory Visit: Payer: BC Managed Care – PPO | Admitting: Physical Therapy

## 2013-12-25 DIAGNOSIS — Z5189 Encounter for other specified aftercare: Secondary | ICD-10-CM | POA: Diagnosis not present

## 2013-12-28 ENCOUNTER — Ambulatory Visit: Payer: BC Managed Care – PPO | Admitting: Physical Therapy

## 2013-12-28 DIAGNOSIS — Z5189 Encounter for other specified aftercare: Secondary | ICD-10-CM | POA: Diagnosis not present

## 2014-01-04 ENCOUNTER — Ambulatory Visit: Payer: BC Managed Care – PPO | Admitting: Physical Therapy

## 2014-01-04 DIAGNOSIS — Z5189 Encounter for other specified aftercare: Secondary | ICD-10-CM | POA: Diagnosis not present

## 2014-01-06 ENCOUNTER — Ambulatory Visit (INDEPENDENT_AMBULATORY_CARE_PROVIDER_SITE_OTHER): Payer: BC Managed Care – PPO

## 2014-01-06 ENCOUNTER — Ambulatory Visit (HOSPITAL_COMMUNITY)
Admission: RE | Admit: 2014-01-06 | Discharge: 2014-01-06 | Disposition: A | Discharge status: Home / Self Care | Payer: BC Managed Care – PPO | Source: Ambulatory Visit | Attending: Obstetrics & Gynecology | Admitting: Obstetrics & Gynecology

## 2014-01-06 ENCOUNTER — Ambulatory Visit: Payer: BC Managed Care – PPO | Admitting: Obstetrics & Gynecology

## 2014-01-06 ENCOUNTER — Other Ambulatory Visit: Payer: Self-pay | Admitting: Obstetrics & Gynecology

## 2014-01-06 ENCOUNTER — Other Ambulatory Visit: Payer: BC Managed Care – PPO

## 2014-01-06 DIAGNOSIS — N921 Excessive and frequent menstruation with irregular cycle: Secondary | ICD-10-CM

## 2014-01-06 DIAGNOSIS — Z1231 Encounter for screening mammogram for malignant neoplasm of breast: Secondary | ICD-10-CM | POA: Diagnosis not present

## 2014-01-06 DIAGNOSIS — Z139 Encounter for screening, unspecified: Secondary | ICD-10-CM

## 2014-01-06 IMAGING — MG MM DIGITAL SCREENING
4 series · 4 of 4 positions shown · non-contrast
Comparison: Previous exam(s).

CLINICAL DATA: Screening.

EXAM:
DIGITAL SCREENING BILATERAL MAMMOGRAM WITH CAD

[L CC]
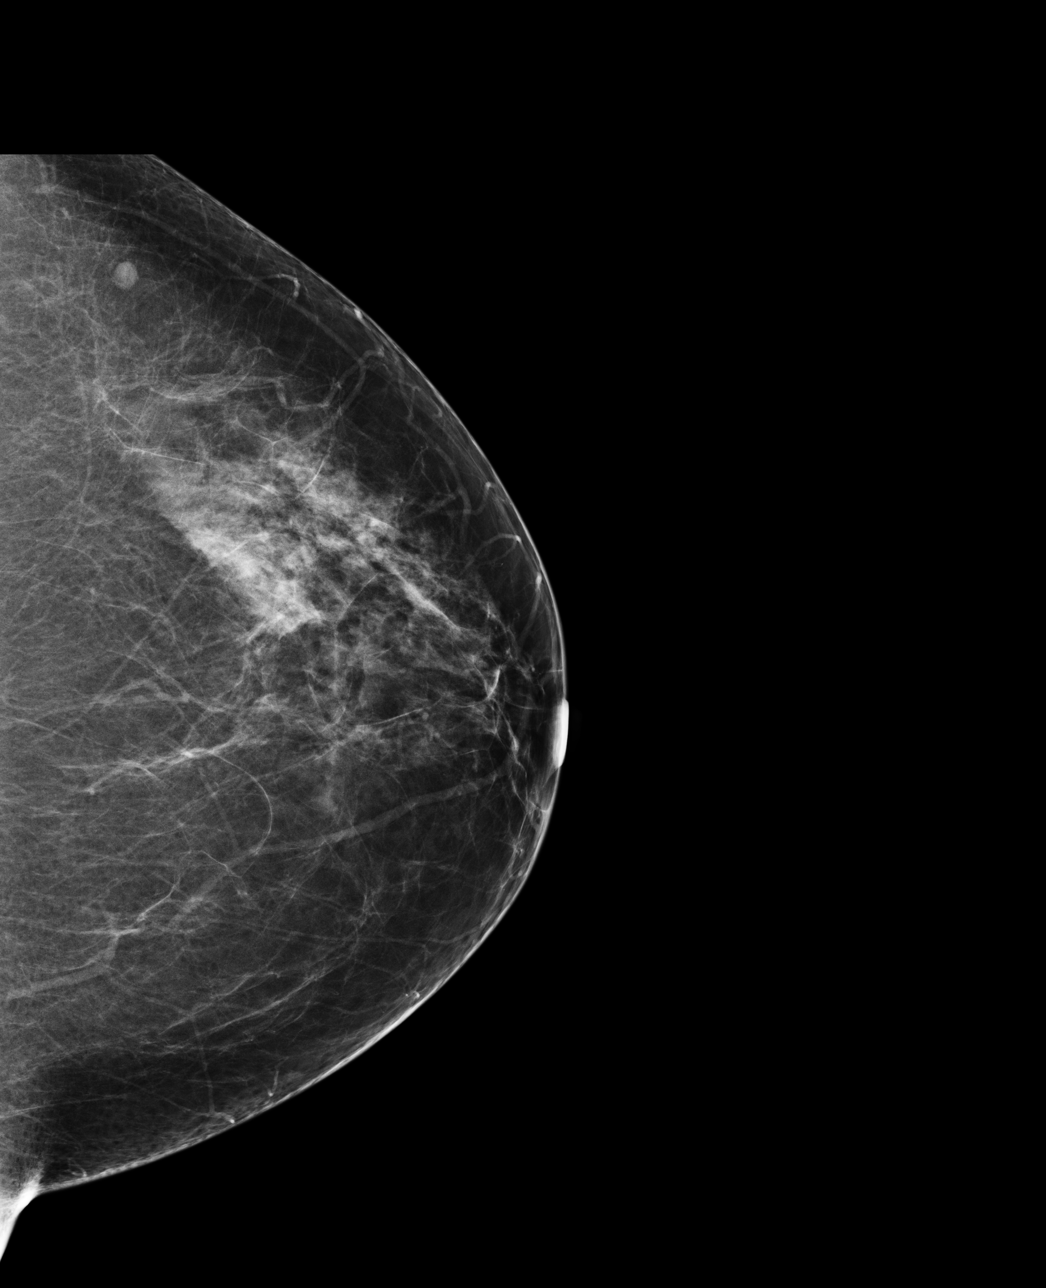

[L MLO]
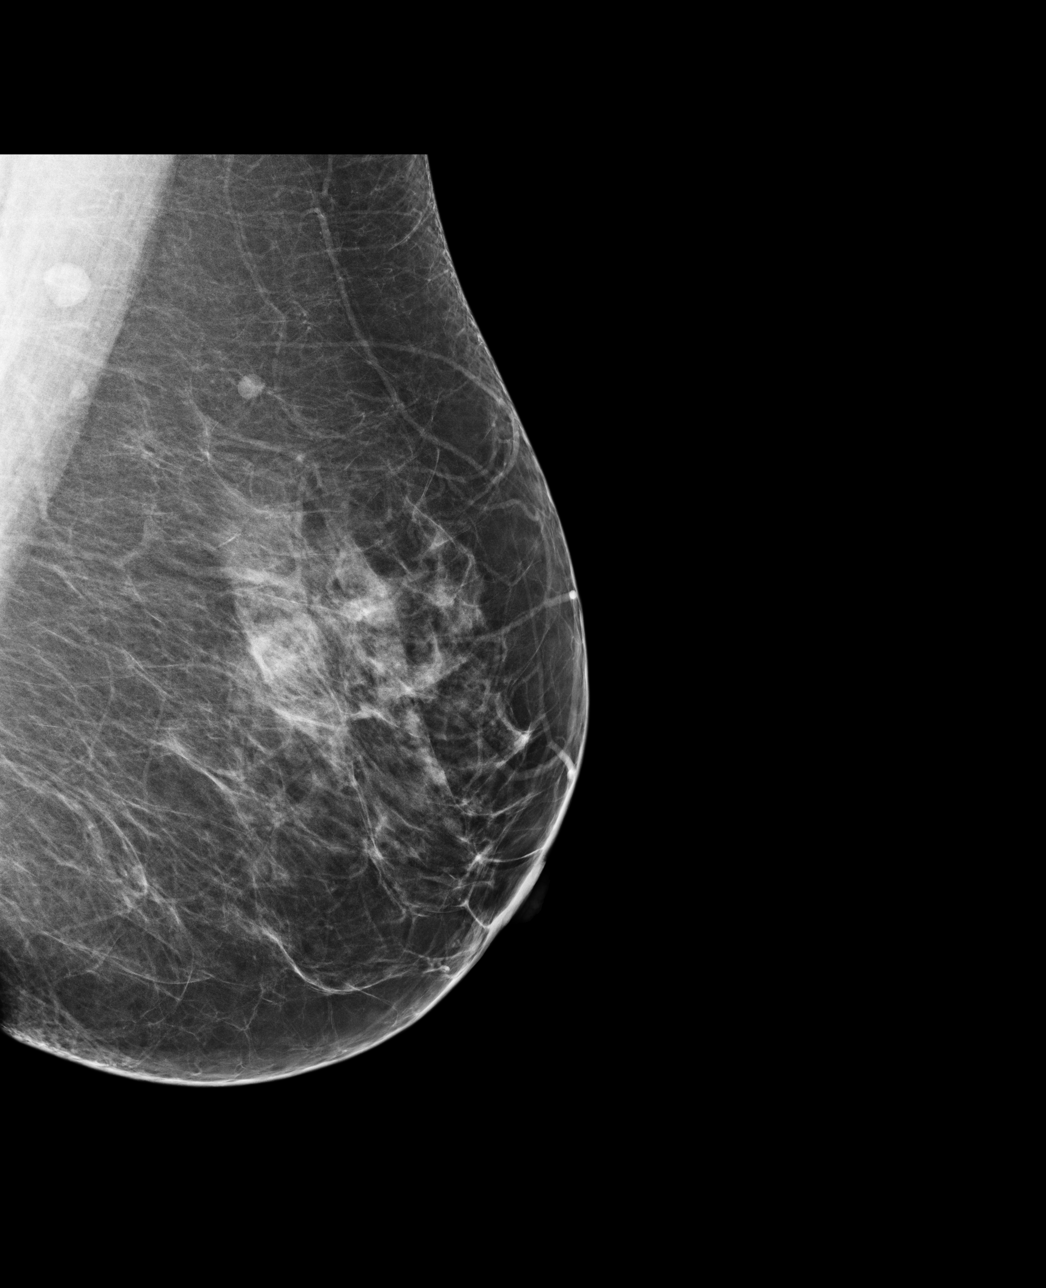

[R CC]
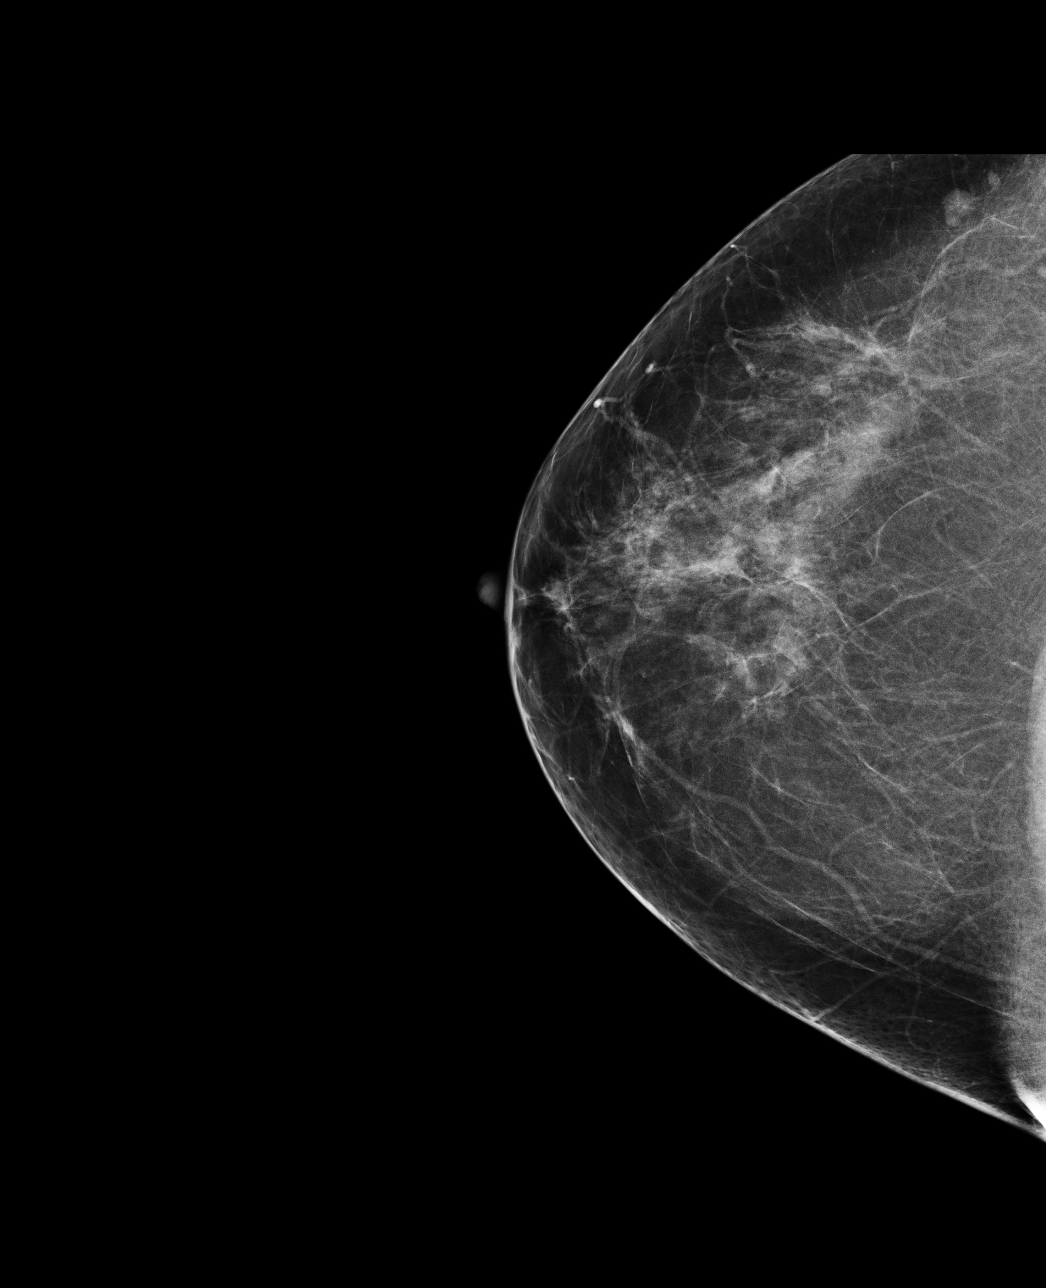

[R MLO]
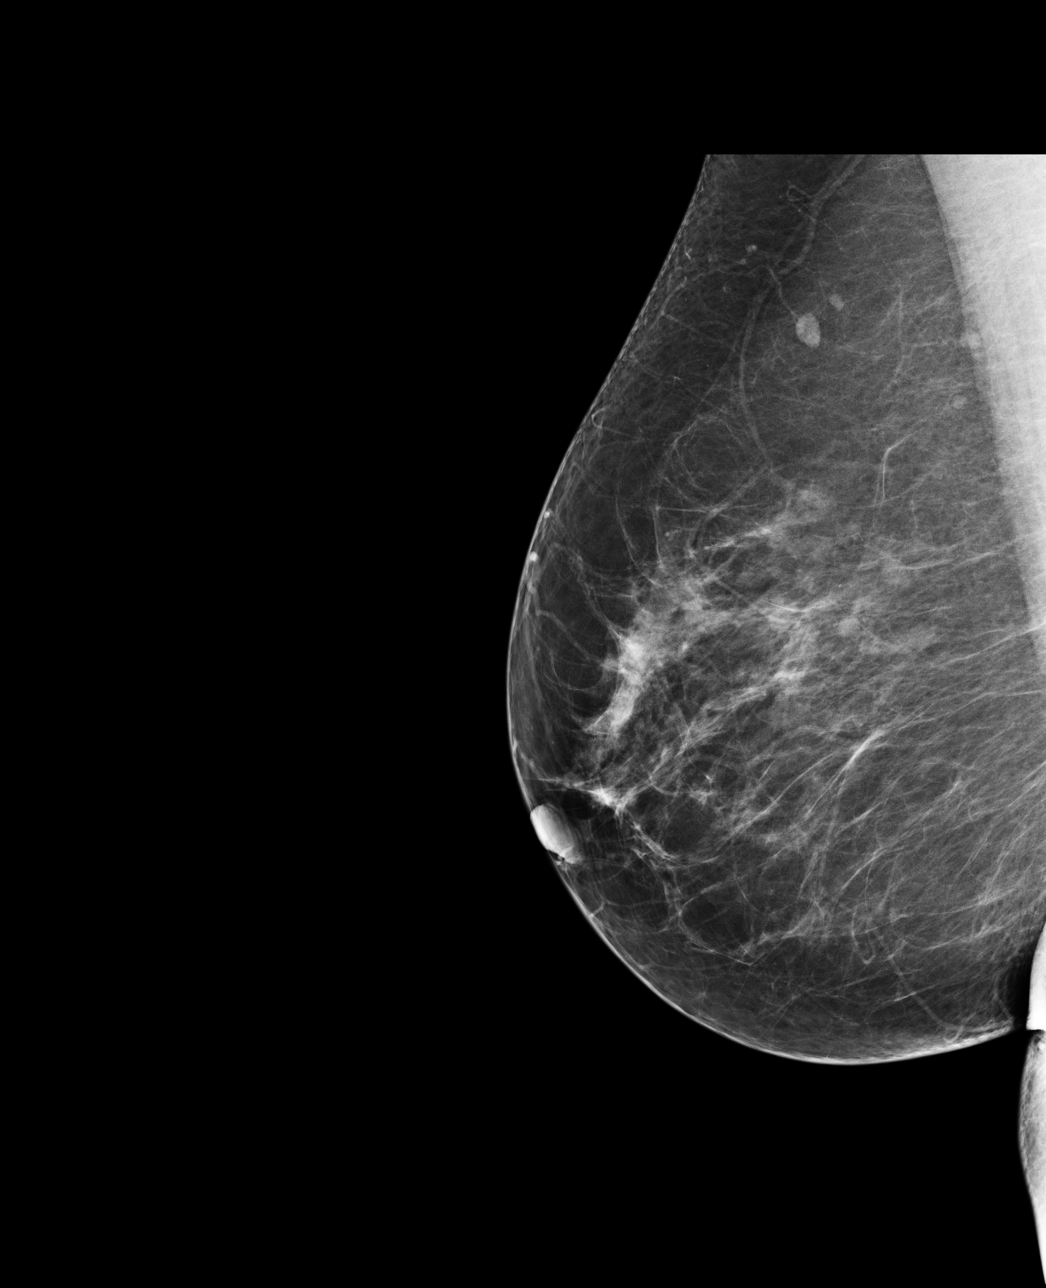

[4 of 4 positions shown; findings below may reference images not displayed]

ACR Breast Density Category b: There are scattered areas of
fibroglandular density.
FINDINGS: There are no findings suspicious for malignancy. Images were
processed with CAD.
IMPRESSION: No mammographic evidence of malignancy. A result letter of this
screening mammogram will be mailed directly to the patient.

RECOMMENDATION:
Screening mammogram in one year. (Code:[US])

BI-RADS CATEGORY  1: Negative.

## 2014-01-08 ENCOUNTER — Ambulatory Visit: Payer: BC Managed Care – PPO | Attending: Specialist | Admitting: Physical Therapy

## 2014-01-08 DIAGNOSIS — Z91040 Latex allergy status: Secondary | ICD-10-CM | POA: Insufficient documentation

## 2014-01-08 DIAGNOSIS — M25561 Pain in right knee: Secondary | ICD-10-CM | POA: Diagnosis not present

## 2014-01-08 DIAGNOSIS — M1712 Unilateral primary osteoarthritis, left knee: Secondary | ICD-10-CM | POA: Insufficient documentation

## 2014-01-08 DIAGNOSIS — Z5189 Encounter for other specified aftercare: Secondary | ICD-10-CM | POA: Insufficient documentation

## 2014-01-08 DIAGNOSIS — Z91030 Bee allergy status: Secondary | ICD-10-CM | POA: Diagnosis not present

## 2014-02-17 NOTE — Progress Notes (Signed)
Subjective:     Kim Reed is a 49 y.o. female here for a routine exam.  Patient's last menstrual period was 11/24/2013. No obstetric history on file. Birth Control Method:  BTL  Menstrual Calendar(currently): regular  Current complaints: post menstrual spotting.   Current acute medical issues:  none   Recent Gynecologic History Patient's last menstrual period was 11/24/2013. Last Pap: 2012  normal Last mammogram: 2011,  normal  History reviewed. No pertinent past medical history.  Past Surgical History  Procedure Laterality Date  . Tubal ligation      OB History    No data available      History   Social History  . Marital Status: Divorced    Spouse Name: N/A    Number of Children: N/A  . Years of Education: N/A   Social History Main Topics  . Smoking status: Former Smoker    Quit date: 02/18/2008  . Smokeless tobacco: Never Used  . Alcohol Use: 0.0 oz/week    0 Not specified per week     Comment: socially  . Drug Use: No  . Sexual Activity: Yes    Birth Control/ Protection: Surgical   Other Topics Concern  . None   Social History Narrative    Family History  Problem Relation Age of Onset  . Thyroid disease Mother   . Cancer Father     prostate  . Thyroid disease Father   . Asthma Sister   . GER disease Brother      Current outpatient prescriptions:  .  amoxicillin (AMOXIL) 500 MG capsule, Take 1 capsule (500 mg total) by mouth 3 (three) times daily., Disp: 30 capsule, Rfl: 0 .  megestrol (MEGACE) 40 MG tablet, Take 1 tablet (40 mg total) by mouth daily., Disp: 30 tablet, Rfl: 11  Review of Systems  Review of Systems  Constitutional: Negative for fever, chills, weight loss, malaise/fatigue and diaphoresis.  HENT: Negative for hearing loss, ear pain, nosebleeds, congestion, sore throat, neck pain, tinnitus and ear discharge.   Eyes: Negative for blurred vision, double vision, photophobia, pain, discharge and redness.  Respiratory: Negative  for cough, hemoptysis, sputum production, shortness of breath, wheezing and stridor.   Cardiovascular: Negative for chest pain, palpitations, orthopnea, claudication, leg swelling and PND.  Gastrointestinal: negative for abdominal pain. Negative for heartburn, nausea, vomiting, diarrhea, constipation, blood in stool and melena.  Genitourinary: Negative for dysuria, urgency, frequency, hematuria and flank pain.  Musculoskeletal: Negative for myalgias, back pain, joint pain and falls.  Skin: Negative for itching and rash.  Neurological: Negative for dizziness, tingling, tremors, sensory change, speech change, focal weakness, seizures, loss of consciousness, weakness and headaches.  Endo/Heme/Allergies: Negative for environmental allergies and polydipsia. Does not bruise/bleed easily.  Psychiatric/Behavioral: Negative for depression, suicidal ideas, hallucinations, memory loss and substance abuse. The patient is not nervous/anxious and does not have insomnia.        Objective:  Blood pressure 110/62, height 5\' 3"  (1.6 m), weight 209 lb (94.802 kg), last menstrual period 11/24/2013.   Physical Exam  Vitals reviewed. Constitutional: She is oriented to person, place, and time. She appears well-developed and well-nourished.  HENT:  Head: Normocephalic and atraumatic.        Right Ear: External ear normal.  Left Ear: External ear normal.  Nose: Nose normal.  Mouth/Throat: Oropharynx is clear and moist.  Eyes: Conjunctivae and EOM are normal. Pupils are equal, round, and reactive to light. Right eye exhibits no discharge. Left eye exhibits no  discharge. No scleral icterus.  Neck: Normal range of motion. Neck supple. No tracheal deviation present. No thyromegaly present.  Cardiovascular: Normal rate, regular rhythm, normal heart sounds and intact distal pulses.  Exam reveals no gallop and no friction rub.   No murmur heard. Respiratory: Effort normal and breath sounds normal. No respiratory  distress. She has no wheezes. She has no rales. She exhibits no tenderness.  GI: Soft. Bowel sounds are normal. She exhibits no distension and no mass. There is no tenderness. There is no rebound and no guarding.  Genitourinary:  Breasts no masses skin changes or nipple changes bilaterally      Vulva is normal without lesions Vagina is pink moist without discharge Cervix normal in appearance and pap is done Uterus is normal size shape and contour Adnexa is negative with normal sized ovaries   Musculoskeletal: Normal range of motion. She exhibits no edema and no tenderness.  Neurological: She is alert and oriented to person, place, and time. She has normal reflexes. She displays normal reflexes. No cranial nerve deficit. She exhibits normal muscle tone. Coordination normal.  Skin: Skin is warm and dry. No rash noted. No erythema. No pallor.  Psychiatric: She has a normal mood and affect. Her behavior is normal. Judgment and thought content normal.       Assessment:    Healthy female exam.    Plan:    Mammogram ordered. Follow up in: 1 year.    Megace for post menstrual spotting Sonogram in a couple of weeks for evaluation

## 2014-03-04 ENCOUNTER — Encounter (INDEPENDENT_AMBULATORY_CARE_PROVIDER_SITE_OTHER): Payer: Self-pay

## 2014-03-04 ENCOUNTER — Ambulatory Visit (INDEPENDENT_AMBULATORY_CARE_PROVIDER_SITE_OTHER): Payer: BLUE CROSS/BLUE SHIELD | Admitting: Family Medicine

## 2014-03-04 ENCOUNTER — Encounter: Payer: Self-pay | Admitting: Family Medicine

## 2014-03-04 VITALS — BP 122/74 | HR 100 | Temp 96.7°F | Ht 63.0 in | Wt 196.8 lb

## 2014-03-04 DIAGNOSIS — J029 Acute pharyngitis, unspecified: Secondary | ICD-10-CM

## 2014-03-04 MED ORDER — METHYLPREDNISOLONE ACETATE 80 MG/ML IJ SUSP
80.0000 mg | Freq: Once | INTRAMUSCULAR | Status: AC
  Administered 2014-03-04: 80 mg via INTRAMUSCULAR

## 2014-03-04 MED ORDER — AMOXICILLIN 875 MG PO TABS: 875.0000 mg | ORAL_TABLET | Freq: Two times a day (BID) | ORAL | Status: DC

## 2014-03-04 NOTE — Progress Notes (Signed)
   Subjective:    Patient ID: Kim Reed, female    DOB: [birth year], 49 y.o.   MRN: [id number]  HPI C/o uri sx's  Review of Systems  Constitutional: Negative for fever.  HENT: Negative for ear pain.   Eyes: Negative for discharge.  Respiratory: Negative for cough.   Cardiovascular: Negative for chest pain.  Gastrointestinal: Negative for abdominal distention.  Endocrine: Negative for polyuria.  Genitourinary: Negative for difficulty urinating.  Musculoskeletal: Negative for gait problem and neck pain.  Skin: Negative for color change and rash.  Neurological: Negative for speech difficulty and headaches.  Psychiatric/Behavioral: Negative for agitation.       Objective:    BP 122/74 mmHg  Pulse 100  Temp(Src) 96.7 F (35.9 C) (Oral)  Ht 5\' 3"  (1.6 m)  Wt 196 lb 12.8 oz (89.268 kg)  BMI 34.87 kg/m2 Physical Exam  Constitutional: She is oriented to person, place, and time. She appears well-developed and well-nourished.  HENT:  Head: Normocephalic and atraumatic.  Mouth/Throat: Oropharynx is clear and moist.  Eyes: Pupils are equal, round, and reactive to light.  Neck: Normal range of motion. Neck supple.  Cardiovascular: Normal rate and regular rhythm.   No murmur heard. Pulmonary/Chest: Effort normal and breath sounds normal.  Abdominal: Soft. Bowel sounds are normal. There is no tenderness.  Neurological: She is alert and oriented to person, place, and time.  Skin: Skin is warm and dry.  Psychiatric: She has a normal mood and affect.          Assessment & Plan:     ICD-9-CM ICD-10-CM   1. Acute pharyngitis, unspecified pharyngitis type 462 J02.9 methylPREDNISolone acetate (DEPO-MEDROL) injection 80 mg     amoxicillin (AMOXIL) 875 MG tablet   Push po fluids, rest, tylenol and motrin otc prn as directed for fever, arthralgias, and myalgias.  Follow up prn if sx's continue or persist.  No Follow-up on file.  Lysbeth Penner FNP

## 2014-10-29 ENCOUNTER — Encounter: Payer: Self-pay | Admitting: Family Medicine

## 2014-10-29 ENCOUNTER — Ambulatory Visit (INDEPENDENT_AMBULATORY_CARE_PROVIDER_SITE_OTHER): Payer: 59 | Admitting: Family Medicine

## 2014-10-29 VITALS — BP 113/73 | HR 67 | Temp 97.2°F | Ht 62.5 in | Wt 205.8 lb

## 2014-10-29 DIAGNOSIS — Z0000 Encounter for general adult medical examination without abnormal findings: Secondary | ICD-10-CM

## 2014-10-29 LAB — POCT GLYCOSYLATED HEMOGLOBIN (HGB A1C): Hemoglobin A1C: 5

## 2014-10-29 NOTE — Progress Notes (Signed)
   HPI  Patient presents today here for an annual physical exam and to get a work form filled out.  She states that overall she is in very good health. She rides horses for a hobby and stays physically active due to that.  She denies fever, chills, sweats, chest pain, headaches, and dyspnea. Around her period each month she has some leg edema, it is not painful and it self resolves.  She does have occasional rapid heartbeat with no associated symptoms. She notices the heart rate is in the 120s on her fitbit.   PMH: Smoking status noted Past medical, surgical, social, and family history reviewed and updated in EMR ROS: Per HPI  Objective: BP 113/73 mmHg  Pulse 67  Temp(Src) 97.2 F (36.2 C) (Oral)  Ht 5' 2.5" (1.588 m)  Wt 205 lb 12.8 oz (93.35 kg)  BMI 37.02 kg/m2 Gen: NAD, alert, cooperative with exam HEENT: NCAT, TMs W not BL, nares clear, oropharynx clear Neck: Trachea midline no thyromegaly CV: RRR, good S1/S2, no murmur Resp: CTABL, no wheezes, non-labored Abd: SNTND, BS present, no guarding or organomegaly Ext: No edema, warm Neuro: Alert and oriented, 2+ patellar tendon reflexes, strength 5/5 and sensation intact in all 4 extremities   Plan:  # Annual physical exam Fasting labs Discussed positive lifestyle choices, encouraged exercise Given difficulty with weight gain and thinning hair mulch check TSH Form to be filled out with results and faxed.     Orders Placed This Encounter  Procedures  . TSH  . T4, Free  . Lipid Panel  . Basic Metabolic Panel  . POCT glycosylated hemoglobin (Hb A1C)    Laroy Apple, MD [facility] 10/29/2014, 8:41 AM

## 2014-10-29 NOTE — Patient Instructions (Signed)
Its great to meet you!  Try to start with 20 minutes of something you can call exercise 3 times a week and slowly work up to 30 minutes 4 times a week.   We will let you know about your labs within a week.

## 2014-10-30 LAB — BASIC METABOLIC PANEL
BUN/Creatinine Ratio: 15 (ref 9–23)
BUN: 11 mg/dL (ref 6–24)
CALCIUM: 9.1 mg/dL (ref 8.7–10.2)
CHLORIDE: 102 mmol/L (ref 97–108)
CO2: 24 mmol/L (ref 18–29)
CREATININE: 0.74 mg/dL (ref 0.57–1.00)
GFR calc non Af Amer: 97 mL/min/{1.73_m2} (ref >59)
GFR, EST AFRICAN AMERICAN: 112 mL/min/{1.73_m2} (ref >59)
Glucose: 86 mg/dL (ref 65–99)
Potassium: 4.6 mmol/L (ref 3.5–5.2)
Sodium: 140 mmol/L (ref 134–144)

## 2014-10-30 LAB — LIPID PANEL
CHOL/HDL RATIO: 4.1 ratio (ref 0.0–4.4)
Cholesterol, Total: 181 mg/dL (ref 100–199)
HDL: 44 mg/dL (ref >39)
LDL Calculated: 114 mg/dL — ABNORMAL HIGH (ref 0–99)
TRIGLYCERIDES: 115 mg/dL (ref 0–149)
VLDL Cholesterol Cal: 23 mg/dL (ref 5–40)

## 2014-10-30 LAB — TSH: TSH: 2.38 u[IU]/mL (ref 0.450–4.500)

## 2014-10-30 LAB — T4, FREE: FREE T4: 1.09 ng/dL (ref 0.82–1.77)

## 2014-11-08 ENCOUNTER — Telehealth: Payer: Self-pay | Admitting: Family Medicine

## 2014-11-08 MED ORDER — TRIAMCINOLONE ACETONIDE 0.5 % EX OINT: 1.0000 "application " | TOPICAL_OINTMENT | Freq: Two times a day (BID) | CUTANEOUS | Status: DC

## 2014-11-08 NOTE — Telephone Encounter (Signed)
Placenta triamcinolone ointment.  If systemic steroid needed, she'll need to be seen.  Laroy Apple, MD [facility] 11/08/2014, 3:14 PM

## 2014-11-08 NOTE — Telephone Encounter (Signed)
Pt aware and understands

## 2014-12-24 ENCOUNTER — Telehealth: Payer: Self-pay | Admitting: *Deleted

## 2014-12-24 NOTE — Telephone Encounter (Signed)
Pt states she wants Megace removed from her medication list due to insurance purposes. Pt informed Megace is not listing under her current medication list in EPIC. Pt verbalized understanding.

## 2015-05-09 ENCOUNTER — Encounter: Payer: Self-pay | Admitting: Family

## 2015-05-09 ENCOUNTER — Ambulatory Visit: Payer: 59

## 2015-05-09 ENCOUNTER — Ambulatory Visit (INDEPENDENT_AMBULATORY_CARE_PROVIDER_SITE_OTHER): Payer: 59 | Admitting: Family

## 2015-05-09 VITALS — BP 117/78 | HR 68 | Temp 97.1°F | Ht 62.5 in | Wt 208.8 lb

## 2015-05-09 DIAGNOSIS — R1012 Left upper quadrant pain: Secondary | ICD-10-CM

## 2015-05-09 MED ORDER — OMEPRAZOLE 20 MG PO CPDR: 20.0000 mg | DELAYED_RELEASE_CAPSULE | Freq: Every day | ORAL | Status: DC

## 2015-05-09 NOTE — Progress Notes (Signed)
   Subjective:    Patient ID: Kim Reed, female    DOB: 1965/09/20, 50 y.o.   MRN: [id number]  Abdominal Pain This is a chronic problem. The current episode started more than 1 year ago. The onset quality is gradual. The problem occurs intermittently. The problem has been waxing and waning. The pain is located in the LUQ. The pain is at a severity of 10/10. The pain is moderate. The quality of the pain is dull and sharp. The abdominal pain does not radiate. Associated symptoms include constipation and nausea. Pertinent negatives include no belching, diarrhea, dysuria, fever, frequency, headaches, hematuria, myalgias or vomiting. Nothing aggravates the pain. The pain is relieved by nothing. She has tried acetaminophen for the symptoms. The treatment provided mild relief. There is no history of GERD.      Review of Systems  Constitutional: Negative.  Negative for fever.  HENT: Negative.   Eyes: Negative.   Respiratory: Negative.  Negative for shortness of breath.   Cardiovascular: Negative.  Negative for palpitations.  Gastrointestinal: Positive for nausea, abdominal pain and constipation. Negative for vomiting and diarrhea.  Endocrine: Negative.   Genitourinary: Negative.  Negative for dysuria, frequency and hematuria.  Musculoskeletal: Negative.  Negative for myalgias.  Neurological: Negative.  Negative for headaches.  Hematological: Negative.   Psychiatric/Behavioral: Negative.   All other systems reviewed and are negative.      Objective:   Physical Exam  Constitutional: She is oriented to person, place, and time. She appears well-developed and well-nourished. No distress.  HENT:  Head: Normocephalic and atraumatic.  Eyes: Pupils are equal, round, and reactive to light.  Neck: Normal range of motion. Neck supple. No thyromegaly present.  Cardiovascular: Normal rate, regular rhythm, normal heart sounds and intact distal pulses.   No murmur heard. Pulmonary/Chest: Effort  normal and breath sounds normal. No respiratory distress. She has no wheezes.  Abdominal: Soft. Bowel sounds are normal. She exhibits no distension. There is no tenderness.  Musculoskeletal: Normal range of motion. She exhibits no edema or tenderness.  Neurological: She is alert and oriented to person, place, and time. She has normal reflexes. No cranial nerve deficit.  Skin: Skin is warm and dry.  Psychiatric: She has a normal mood and affect. Her behavior is normal. Judgment and thought content normal.  Vitals reviewed.     BP 117/78 mmHg  Pulse 68  Temp(Src) 97.1 F (36.2 C) (Oral)  Ht 5' 2.5" (1.588 m)  Wt 208 lb 12.8 oz (94.711 kg)  BMI 37.56 kg/m2  LMP 04/17/2015     Assessment & Plan:  1. LUQ abdominal pain -Force fluids -Stool softener as needed to avoid constipation -Pt to come get KUB tomorrow -RTO prn, if pain worsens pt to go to ED - CBC with Differential/Platelet - CMP14+EGFR - Amylase - Lipase - DG Abd 1 View; Future - omeprazole (PRILOSEC) 20 MG capsule; Take 1 capsule (20 mg total) by mouth daily.  Dispense: 30 capsule; Refill: [place], FNP

## 2015-05-09 NOTE — Patient Instructions (Signed)

## 2015-05-10 ENCOUNTER — Other Ambulatory Visit (INDEPENDENT_AMBULATORY_CARE_PROVIDER_SITE_OTHER): Payer: 59

## 2015-05-10 DIAGNOSIS — R1012 Left upper quadrant pain: Secondary | ICD-10-CM

## 2015-05-10 IMAGING — CR DG ABDOMEN 1V
1 series · 1 of 1 positions shown · non-contrast
Comparison: None.

CLINICAL DATA: Left upper quadrant pain

EXAM:
ABDOMEN - 1 VIEW

[view not recorded]
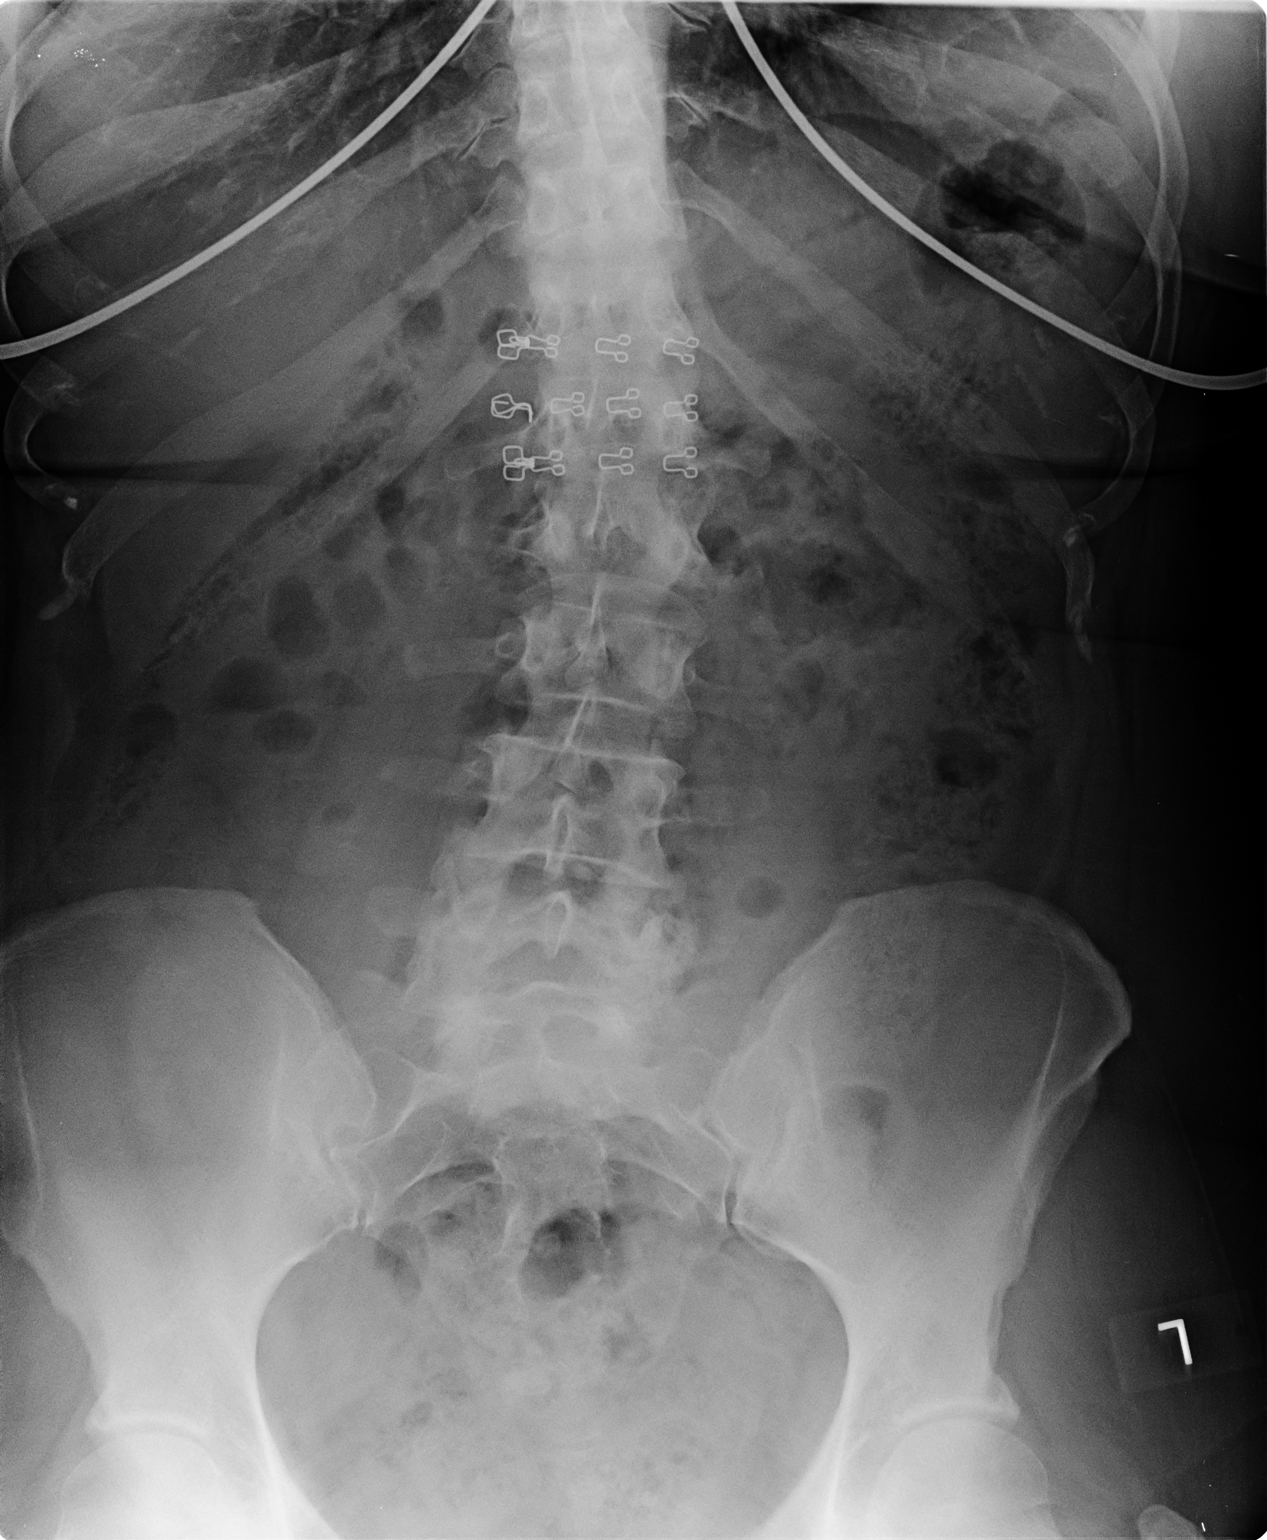

[1 of 1 positions shown; findings below may reference images not displayed]

FINDINGS: A supine film of the abdomen shows a moderate amount of feces
throughout the colon. No bowel obstruction is seen. No edema of
bowel is noted. No opaque calculi are noted. There is a lumbar
scoliosis slightly convex to the left.
IMPRESSION: No bowel obstruction.  No opaque calculi.

## 2015-05-10 NOTE — Progress Notes (Signed)
Patient aware.

## 2015-05-11 ENCOUNTER — Telehealth: Payer: Self-pay | Admitting: Family Medicine

## 2015-05-11 LAB — CBC WITH DIFFERENTIAL/PLATELET
BASOS ABS: 0 10*3/uL (ref 0.0–0.2)
Basos: 0 %
EOS (ABSOLUTE): 0.1 10*3/uL (ref 0.0–0.4)
Eos: 1 %
HEMOGLOBIN: 13.1 g/dL (ref 11.1–15.9)
Hematocrit: 38.7 % (ref 34.0–46.6)
IMMATURE GRANS (ABS): 0.1 10*3/uL (ref 0.0–0.1)
IMMATURE GRANULOCYTES: 1 %
LYMPHS: 30 %
Lymphocytes Absolute: 2.6 10*3/uL (ref 0.7–3.1)
MCH: 31.5 pg (ref 26.6–33.0)
MCHC: 33.9 g/dL (ref 31.5–35.7)
MCV: 93 fL (ref 79–97)
MONOCYTES: 9 %
Monocytes Absolute: 0.8 10*3/uL (ref 0.1–0.9)
NEUTROS PCT: 59 %
Neutrophils Absolute: 5.2 10*3/uL (ref 1.4–7.0)
PLATELETS: 318 10*3/uL (ref 150–379)
RBC: 4.16 x10E6/uL (ref 3.77–5.28)
RDW: 13 % (ref 12.3–15.4)
WBC: 8.8 10*3/uL (ref 3.4–10.8)

## 2015-05-11 LAB — CMP14+EGFR
ALBUMIN: 4 g/dL (ref 3.5–5.5)
ALT: 13 IU/L (ref 0–32)
AST: 12 IU/L (ref 0–40)
Albumin/Globulin Ratio: 1.3 (ref 1.2–2.2)
Alkaline Phosphatase: 103 IU/L (ref 39–117)
BUN / CREAT RATIO: 14 (ref 9–23)
BUN: 9 mg/dL (ref 6–24)
CHLORIDE: 102 mmol/L (ref 96–106)
CO2: 24 mmol/L (ref 18–29)
CREATININE: 0.66 mg/dL (ref 0.57–1.00)
Calcium: 8.6 mg/dL — ABNORMAL LOW (ref 8.7–10.2)
GFR calc non Af Amer: 106 mL/min/{1.73_m2} (ref >59)
GFR, EST AFRICAN AMERICAN: 122 mL/min/{1.73_m2} (ref >59)
GLUCOSE: 85 mg/dL (ref 65–99)
Globulin, Total: 3 g/dL (ref 1.5–4.5)
Potassium: 4.1 mmol/L (ref 3.5–5.2)
Sodium: 141 mmol/L (ref 134–144)
TOTAL PROTEIN: 7 g/dL (ref 6.0–8.5)

## 2015-05-11 LAB — LIPASE: Lipase: 35 U/L (ref 0–59)

## 2015-05-11 LAB — AMYLASE: AMYLASE: 37 U/L (ref 31–124)

## 2015-05-11 NOTE — Telephone Encounter (Signed)
Called patient and informed that doctor has not reviewed labs and we will call back as soon as we get them back.  Patient verbalized understanding.

## 2015-09-13 ENCOUNTER — Encounter: Payer: Self-pay | Admitting: Family Medicine

## 2015-09-13 ENCOUNTER — Ambulatory Visit (INDEPENDENT_AMBULATORY_CARE_PROVIDER_SITE_OTHER): Payer: 59 | Admitting: Family Medicine

## 2015-09-13 VITALS — BP 134/75 | HR 69 | Temp 97.2°F | Ht 62.5 in | Wt 206.4 lb

## 2015-09-13 DIAGNOSIS — E669 Obesity, unspecified: Secondary | ICD-10-CM

## 2015-09-13 DIAGNOSIS — Z0000 Encounter for general adult medical examination without abnormal findings: Secondary | ICD-10-CM | POA: Diagnosis not present

## 2015-09-13 DIAGNOSIS — K5900 Constipation, unspecified: Secondary | ICD-10-CM

## 2015-09-13 LAB — BAYER DCA HB A1C WAIVED: HB A1C: 5.1 % (ref <7.0)

## 2015-09-13 NOTE — Progress Notes (Signed)
   HPI  Patient presents today here for annual labs and to discuss constipation obesity.  Labs Requested by her employer. No history of hyperlipidemia or diabetes.  Obesity Has been trying to cut back on sodas, she is active, she rides horses and feed them every morning, feeling numb means walking up and down a hill on average 20-30 minutes a day. 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  Constipation Previously noted left upper quadrant pain that resolved after starting laxatives. She's now requiring 2 Senokot-S every day. She does not have any additional abdominal pain.  PMH: Smoking status noted ROS: Per HPI  Objective: BP 134/75 (BP Location: Right Arm, Patient Position: Sitting, Cuff Size: Large)   Pulse 69   Temp 97.2 F (36.2 C) (Oral)   Ht 5' 2.5" (1.588 m)   Wt 206 lb 6.4 oz (93.6 kg)   BMI 37.15 kg/m  Gen: NAD, alert, cooperative with exam HEENT: NCAT, oropharynx clear, TM normal on the left, obscured by cerumen on the right , nares clear Neck: No thyromegaly CV: RRR, good S1/S2, no murmur Resp: CTABL, no wheezes, non-labored Abd: SNTND, BS present, no guarding or organomegaly Ext: No edema, warm Neuro: Alert and oriented, strength 5/5 and sensation intact in all 4 extremities 2+ patellar tendon reflexes bilaterally  Assessment and plan:  # Annual physical exam Normal exam Labs ordered   # Obesity Discussed diet and exercise and lifestyle changes.  # Constipation Abd pain improved, no need for cleanout Transition from Senokot to miralax      Orders Placed This Encounter  Procedures  . Bayer DCA Hb A1c Waived  . CBC with Differential/Platelet  . CMP14+EGFR  . Lipid panel  . TSH    Laroy Apple, MD [facility] 09/13/2015, 8:48 AM

## 2015-09-13 NOTE — Patient Instructions (Signed)
Great to see you!  Try miralax 1/2 to 2 capfuls daily instead of senna.  Go up or down to get 1 easy stool daily.   Lets plan to see you at least once a year unless you need [place] sooner.    Continue your exercise regimen, consider adding in some more intense cardio 20 minutes 3 times a week Try to cut out sodas completely.

## 2015-09-14 LAB — CMP14+EGFR
ALBUMIN: 4.1 g/dL (ref 3.5–5.5)
ALK PHOS: 99 IU/L (ref 39–117)
ALT: 12 IU/L (ref 0–32)
AST: 15 IU/L (ref 0–40)
Albumin/Globulin Ratio: 1.4 (ref 1.2–2.2)
BUN/Creatinine Ratio: 15 (ref 9–23)
BUN: 12 mg/dL (ref 6–24)
Bilirubin Total: <0.2 mg/dL (ref 0.0–1.2)
CALCIUM: 9 mg/dL (ref 8.7–10.2)
CO2: 24 mmol/L (ref 18–29)
CREATININE: 0.81 mg/dL (ref 0.57–1.00)
Chloride: 104 mmol/L (ref 96–106)
GFR calc Af Amer: 100 mL/min/{1.73_m2} (ref >59)
GFR, EST NON AFRICAN AMERICAN: 87 mL/min/{1.73_m2} (ref >59)
GLOBULIN, TOTAL: 2.9 g/dL (ref 1.5–4.5)
GLUCOSE: 88 mg/dL (ref 65–99)
Potassium: 5.1 mmol/L (ref 3.5–5.2)
Sodium: 142 mmol/L (ref 134–144)
Total Protein: 7 g/dL (ref 6.0–8.5)

## 2015-09-14 LAB — CBC WITH DIFFERENTIAL/PLATELET
BASOS ABS: 0.1 10*3/uL (ref 0.0–0.2)
Basos: 1 %
EOS (ABSOLUTE): 0.2 10*3/uL (ref 0.0–0.4)
EOS: 2 %
HEMATOCRIT: 39.4 % (ref 34.0–46.6)
HEMOGLOBIN: 13.2 g/dL (ref 11.1–15.9)
IMMATURE GRANULOCYTES: 1 %
Immature Grans (Abs): 0 10*3/uL (ref 0.0–0.1)
LYMPHS ABS: 2.3 10*3/uL (ref 0.7–3.1)
Lymphs: 27 %
MCH: 31.4 pg (ref 26.6–33.0)
MCHC: 33.5 g/dL (ref 31.5–35.7)
MCV: 94 fL (ref 79–97)
MONOCYTES: 9 %
Monocytes Absolute: 0.8 10*3/uL (ref 0.1–0.9)
NEUTROS PCT: 60 %
Neutrophils Absolute: 5.1 10*3/uL (ref 1.4–7.0)
Platelets: 278 10*3/uL (ref 150–379)
RBC: 4.21 x10E6/uL (ref 3.77–5.28)
RDW: 12.8 % (ref 12.3–15.4)
WBC: 8.4 10*3/uL (ref 3.4–10.8)

## 2015-09-14 LAB — TSH: TSH: 1.66 u[IU]/mL (ref 0.450–4.500)

## 2015-09-14 LAB — LIPID PANEL
CHOL/HDL RATIO: 4.1 ratio (ref 0.0–4.4)
CHOLESTEROL TOTAL: 172 mg/dL (ref 100–199)
HDL: 42 mg/dL (ref >39)
LDL CALC: 104 mg/dL — AB (ref 0–99)
TRIGLYCERIDES: 131 mg/dL (ref 0–149)
VLDL CHOLESTEROL CAL: 26 mg/dL (ref 5–40)

## 2015-09-15 ENCOUNTER — Telehealth: Payer: Self-pay | Admitting: *Deleted

## 2015-09-15 NOTE — Telephone Encounter (Signed)
Pt notified of results Verbalizes understanding 

## 2015-09-15 NOTE — Telephone Encounter (Signed)
-----   Message from Timmothy Euler, MD sent at 09/15/2015 12:11 PM EDT ----- Will you call about labs? Her blood counts, kidneys, liver, cholesterol, and thyroid look good.  Her A1C was good Her paper is being faxed in and has been filled out and signed.  Thanks! sam

## 2015-10-12 ENCOUNTER — Telehealth: Payer: Self-pay | Admitting: Family Medicine

## 2015-10-18 NOTE — Telephone Encounter (Signed)
I do not have the form but I will gladly fill it out a again if needed  Laroy Apple, MD [facility] 10/18/2015, 9:52 AM

## 2015-10-18 NOTE — Telephone Encounter (Signed)
Patient will bring new form by the office to have it filled out

## 2015-10-20 ENCOUNTER — Telehealth: Payer: Self-pay | Admitting: Family Medicine

## 2015-10-21 NOTE — Telephone Encounter (Signed)
Paperwork faxed °

## 2016-02-09 ENCOUNTER — Ambulatory Visit (INDEPENDENT_AMBULATORY_CARE_PROVIDER_SITE_OTHER): Payer: 59 | Admitting: Pediatrics

## 2016-02-09 VITALS — BP 116/71 | HR 88 | Temp 97.7°F | Ht 62.5 in | Wt 213.6 lb

## 2016-02-09 DIAGNOSIS — B029 Zoster without complications: Secondary | ICD-10-CM | POA: Diagnosis not present

## 2016-02-09 MED ORDER — VALACYCLOVIR HCL 1 G PO TABS: 1000.0000 mg | ORAL_TABLET | Freq: Three times a day (TID) | ORAL | 0 refills | Status: DC

## 2016-02-09 NOTE — Progress Notes (Signed)
  Subjective:   Patient ID: Kim Reed, female    DOB: July 09, 1965, 51 y.o.   MRN: [id number] CC: Rash (painful blistery rash x 2 days)  HPI: Kim Reed is a 51 y.o. female presenting for Rash (painful blistery rash x 2 days)  Started with pain in skin Rash started yesterday No fevers Otherwise feeling well Has never had shingles before  Relevant past medical, surgical, family and social history reviewed. Allergies and medications reviewed and updated. History  Smoking Status  . Former Smoker  . Quit date: 02/18/2008  Smokeless Tobacco  . Never Used   ROS: Per HPI   Objective:    BP 116/71 (BP Location: Left Arm, Patient Position: Sitting, Cuff Size: Large)   Pulse 88   Temp 97.7 F (36.5 C) (Oral)   Ht 5' 2.5" (1.588 m)   Wt 213 lb 9.6 oz (96.9 kg)   BMI 38.45 kg/m   Wt Readings from Last 3 Encounters:  02/09/16 213 lb 9.6 oz (96.9 kg)  09/13/15 206 lb 6.4 oz (93.6 kg)  05/09/15 208 lb 12.8 oz (94.7 kg)    Gen: NAD, alert, cooperative with exam, NCAT EYES: EOMI, no conjunctival injection, or no icterus CV: WWP Resp: normal WOB Neuro: Alert and oriented Skin: on anterior abd several cm patch x2 of red papules with surrounded redness very TTP. Skin around red papules without rash but also very TTP. Rash comes up to but does not cross midline. Has some sensitive/tender areas of skin around side to back in a band  Assessment & Plan:  Kim Reed was seen today for rash.  Diagnoses and all orders for this visit:  Herpes zoster without complication -     valACYclovir (VALTREX) 1000 MG tablet; Take 1 tablet (1,000 mg total) by mouth 3 (three) times daily.   Follow up plan: Return if symptoms worsen or fail to improve. Assunta Found, MD [facility]

## 2016-02-11 ENCOUNTER — Ambulatory Visit: Payer: 59

## 2016-08-23 ENCOUNTER — Other Ambulatory Visit (HOSPITAL_COMMUNITY)
Admission: RE | Admit: 2016-08-23 | Discharge: 2016-08-23 | Disposition: A | Discharge status: Home / Self Care | Payer: 59 | Source: Ambulatory Visit | Attending: Obstetrics & Gynecology | Admitting: Obstetrics & Gynecology

## 2016-08-23 ENCOUNTER — Ambulatory Visit (INDEPENDENT_AMBULATORY_CARE_PROVIDER_SITE_OTHER): Payer: 59 | Admitting: Obstetrics & Gynecology

## 2016-08-23 ENCOUNTER — Other Ambulatory Visit: Payer: Self-pay | Admitting: Obstetrics & Gynecology

## 2016-08-23 ENCOUNTER — Encounter: Payer: Self-pay | Admitting: Obstetrics & Gynecology

## 2016-08-23 VITALS — BP 112/72 | HR 55 | Ht 63.0 in | Wt 202.5 lb

## 2016-08-23 DIAGNOSIS — Z1231 Encounter for screening mammogram for malignant neoplasm of breast: Secondary | ICD-10-CM

## 2016-08-23 DIAGNOSIS — Z01419 Encounter for gynecological examination (general) (routine) without abnormal findings: Secondary | ICD-10-CM

## 2016-08-23 NOTE — Progress Notes (Signed)
Subjective:     Kim Reed is a 51 y.o. female here for a routine exam.  Patient's last menstrual period was 07/30/2016. [id number] Birth Control Method:  BTL Menstrual Calendar(currently): regular heavy painful  Current complaints: just period issues.   Current acute medical issues:  Back spasm   Recent Gynecologic History Patient's last menstrual period was 07/30/2016. Last Pap: 2015,  normal Last mammogram: 2015,  normal  Past Medical History:  Diagnosis Date  . Endometriosis   . Ovarian cyst     Past Surgical History:  Procedure Laterality Date  . EYE SURGERY    . TUBAL LIGATION      OB History    Gravida Para Term Preterm AB Living   2 2 2     2    SAB TAB Ectopic Multiple Live Births           2      Social History   Social History  . Marital status: Divorced    Spouse name: N/A  . Number of children: N/A  . Years of education: N/A   Social History Main Topics  . Smoking status: Former Smoker    Types: Cigarettes    Quit date: 02/18/2008  . Smokeless tobacco: Never Used  . Alcohol use 0.0 oz/week     Comment: socially  . Drug use: No  . Sexual activity: Yes    Birth control/ protection: Surgical     Comment: tubal   Other Topics Concern  . None   Social History Narrative  . None    Family History  Problem Relation Age of Onset  . Thyroid disease Mother   . Cancer Father        prostate  . Thyroid disease Father   . Asthma Sister   . Other Sister        had heavy periods  . GER disease Brother   . Cancer Paternal Grandmother   . Alzheimer's disease Maternal Grandmother   . Heart attack Maternal Grandfather   . Breast cancer Other   . Colon cancer Other      Current Outpatient Prescriptions:  Marland Kitchen  Multiple Vitamin (MULTI VITAMIN PO), Take by mouth daily. , Disp: , Rfl:   Review of Systems  Review of Systems  Constitutional: Negative for fever, chills, weight loss, malaise/fatigue and diaphoresis.  HENT: Negative for hearing  loss, ear pain, nosebleeds, congestion, sore throat, neck pain, tinnitus and ear discharge.   Eyes: Negative for blurred vision, double vision, photophobia, pain, discharge and redness.  Respiratory: Negative for cough, hemoptysis, sputum production, shortness of breath, wheezing and stridor.   Cardiovascular: Negative for chest pain, palpitations, orthopnea, claudication, leg swelling and PND.  Gastrointestinal: negative for abdominal pain. Negative for heartburn, nausea, vomiting, diarrhea, constipation, blood in stool and melena.  Genitourinary: Negative for dysuria, urgency, frequency, hematuria and flank pain.  Musculoskeletal: Negative for myalgias, back pain, joint pain and falls.  Skin: Negative for itching and rash.  Neurological: Negative for dizziness, tingling, tremors, sensory change, speech change, focal weakness, seizures, loss of consciousness, weakness and headaches.  Endo/Heme/Allergies: Negative for environmental allergies and polydipsia. Does not bruise/bleed easily.  Psychiatric/Behavioral: Negative for depression, suicidal ideas, hallucinations, memory loss and substance abuse. The patient is not nervous/anxious and does not have insomnia.        Objective:  Blood pressure 112/72, pulse (!) 55, height 5\' 3"  (1.6 m), weight 202 lb 8 oz (91.9 kg), last menstrual period 07/30/2016.   Physical Exam  Vitals reviewed. Constitutional: She is oriented to person, place, and time. She appears well-developed and well-nourished.  HENT:  Head: Normocephalic and atraumatic.        Right Ear: External ear normal.  Left Ear: External ear normal.  Nose: Nose normal.  Mouth/Throat: Oropharynx is clear and moist.  Eyes: Conjunctivae and EOM are normal. Pupils are equal, round, and reactive to light. Right eye exhibits no discharge. Left eye exhibits no discharge. No scleral icterus.  Neck: Normal range of motion. Neck supple. No tracheal deviation present. No thyromegaly present.   Cardiovascular: Normal rate, regular rhythm, normal heart sounds and intact distal pulses.  Exam reveals no gallop and no friction rub.   No murmur heard. Respiratory: Effort normal and breath sounds normal. No respiratory distress. She has no wheezes. She has no rales. She exhibits no tenderness.  GI: Soft. Bowel sounds are normal. She exhibits no distension and no mass. There is no tenderness. There is no rebound and no guarding.  Genitourinary:  Breasts no masses skin changes or nipple changes bilaterally      Vulva is normal without lesions Vagina is pink moist without discharge Cervix normal in appearance and pap is done Uterus is normal size shape and contour Adnexa is negative with normal sized ovaries   Musculoskeletal: Normal range of motion. She exhibits no edema and no tenderness.  Neurological: She is alert and oriented to person, place, and time. She has normal reflexes. She displays normal reflexes. No cranial nerve deficit. She exhibits normal muscle tone. Coordination normal.  Skin: Skin is warm and dry. No rash noted. No erythema. No pallor.  Psychiatric: She has a normal mood and affect. Her behavior is normal. Judgment and thought content normal.       Medications Ordered at today's visit: No orders of the defined types were placed in this encounter.   Other orders placed at today's visit: No orders of the defined types were placed in this encounter.     Assessment:    Healthy female exam.   needs mammogram reinforced Plan:    Contraception: tubal ligation. Mammogram ordered.   Discussed endometrial ablation, pt considering, would need a sonogram  Return in about 1 year (around 08/23/2017) for yearly, with Dr Elonda Husky.

## 2016-08-24 LAB — CYTOLOGY - PAP
CHLAMYDIA, DNA PROBE: NEGATIVE
DIAGNOSIS: NEGATIVE
NEISSERIA GONORRHEA: NEGATIVE

## 2016-09-13 ENCOUNTER — Ambulatory Visit (HOSPITAL_COMMUNITY)
Admission: RE | Admit: 2016-09-13 | Discharge: 2016-09-13 | Disposition: A | Discharge status: Home / Self Care | Payer: 59 | Source: Ambulatory Visit | Attending: Obstetrics & Gynecology | Admitting: Obstetrics & Gynecology

## 2016-09-13 DIAGNOSIS — Z1231 Encounter for screening mammogram for malignant neoplasm of breast: Secondary | ICD-10-CM | POA: Diagnosis not present

## 2016-09-13 IMAGING — MG 2D DIGITAL SCREENING BILATERAL MAMMOGRAM WITH CAD AND ADJUNCT TO
8 series · 8 of 24 positions shown · non-contrast
Comparison: Previous exam(s).

CLINICAL DATA: Screening.

EXAM:
2D DIGITAL SCREENING BILATERAL MAMMOGRAM WITH CAD AND ADJUNCT TOMO

[L MLO]
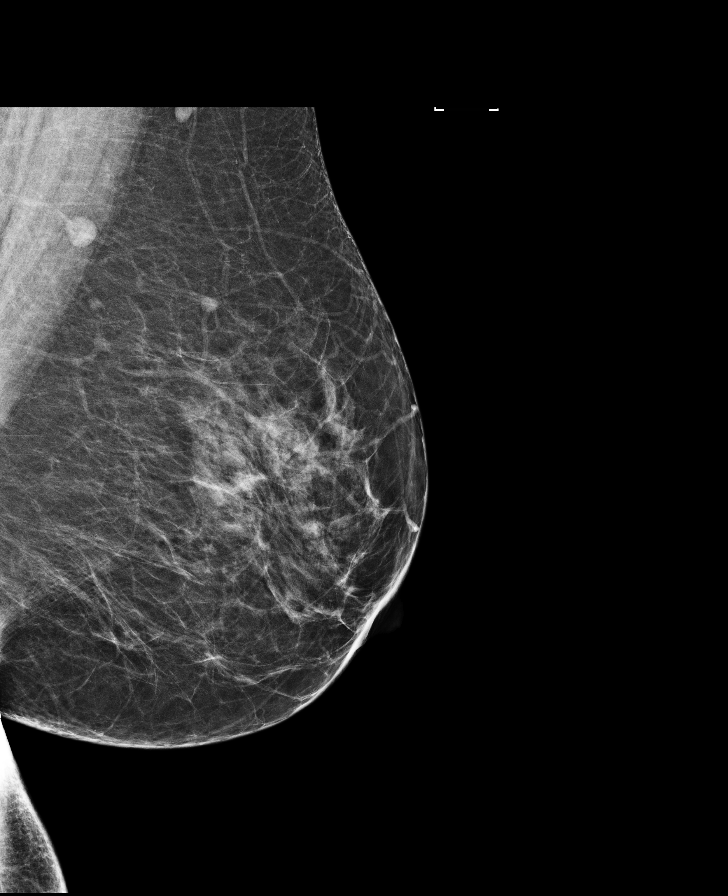

[R CC]
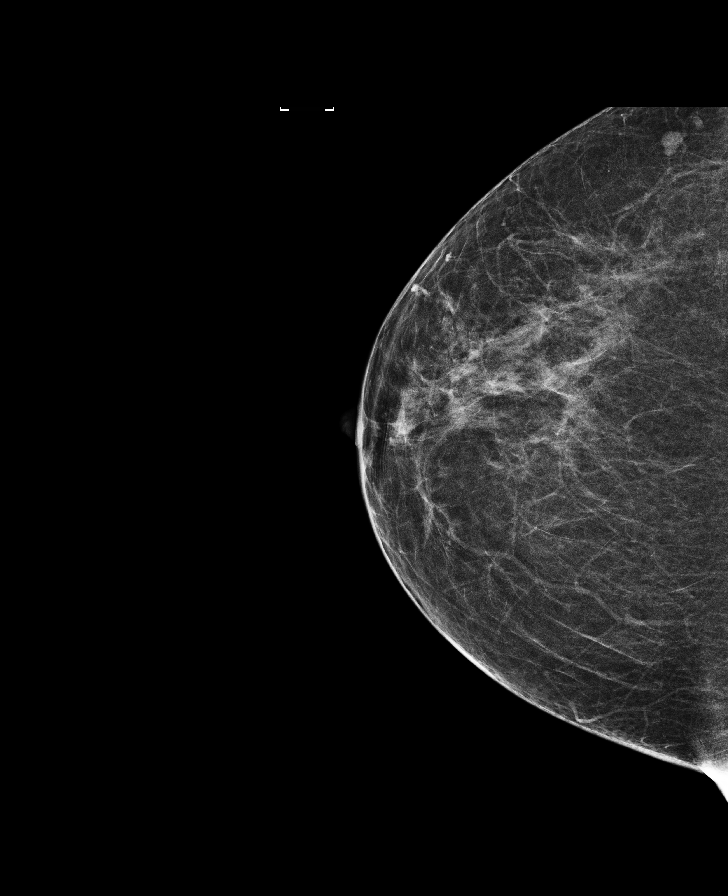

[L CC]
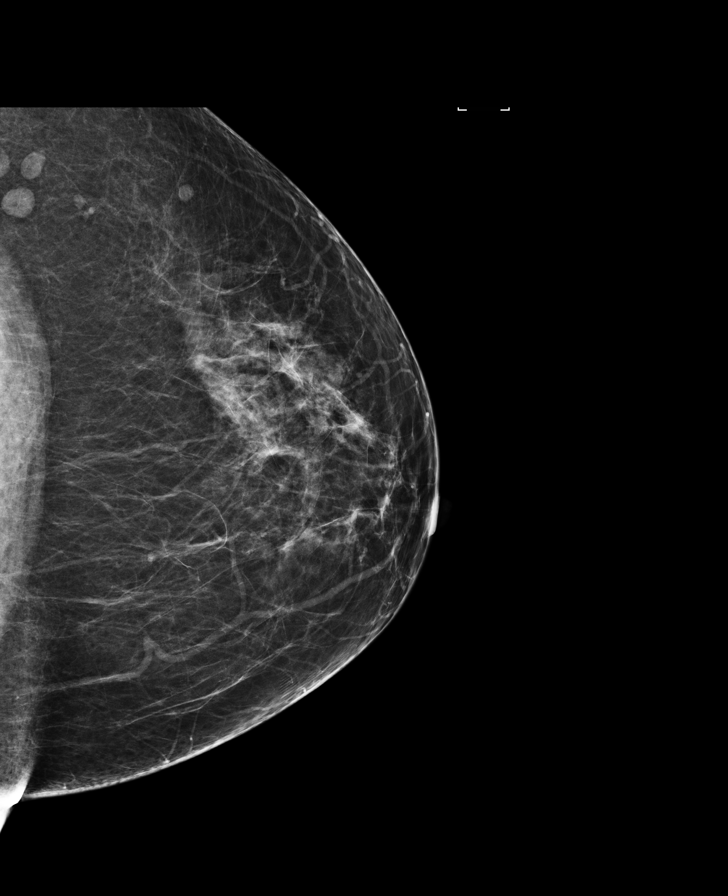

[R MLO]
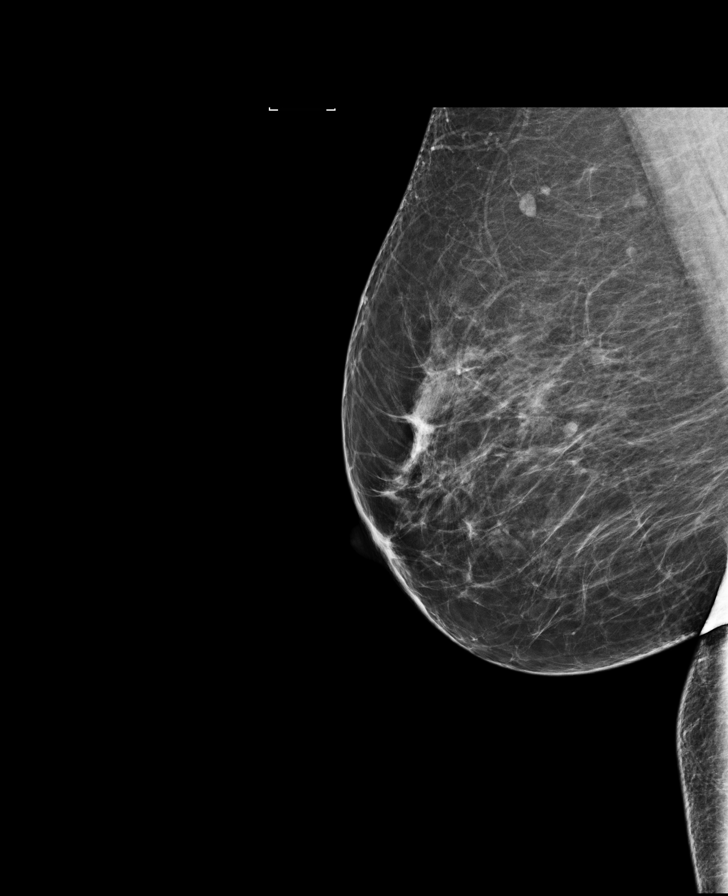

[L CC tomo · tomo slice 35/70.0]
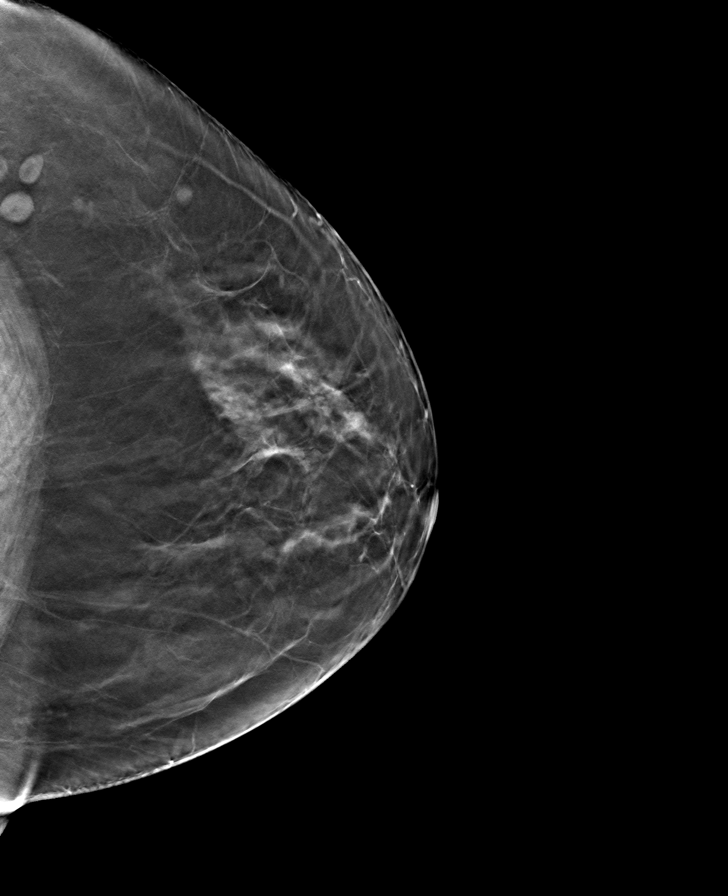

[R MLO tomo · tomo slice 35/70.0]
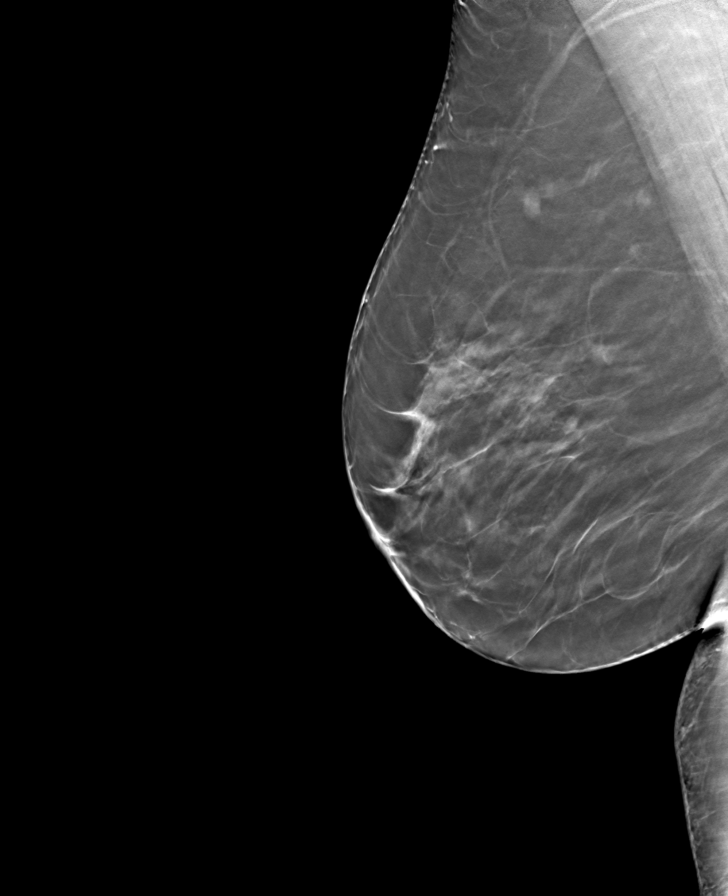

[R CC tomo · tomo slice 30/59.0]
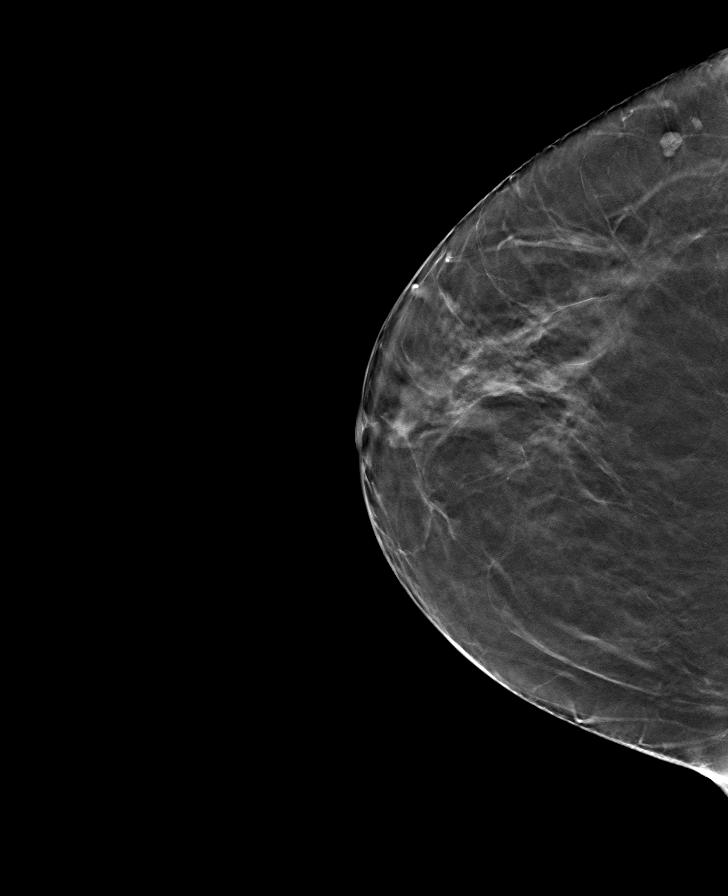

[L MLO tomo · tomo slice 35/68.0]
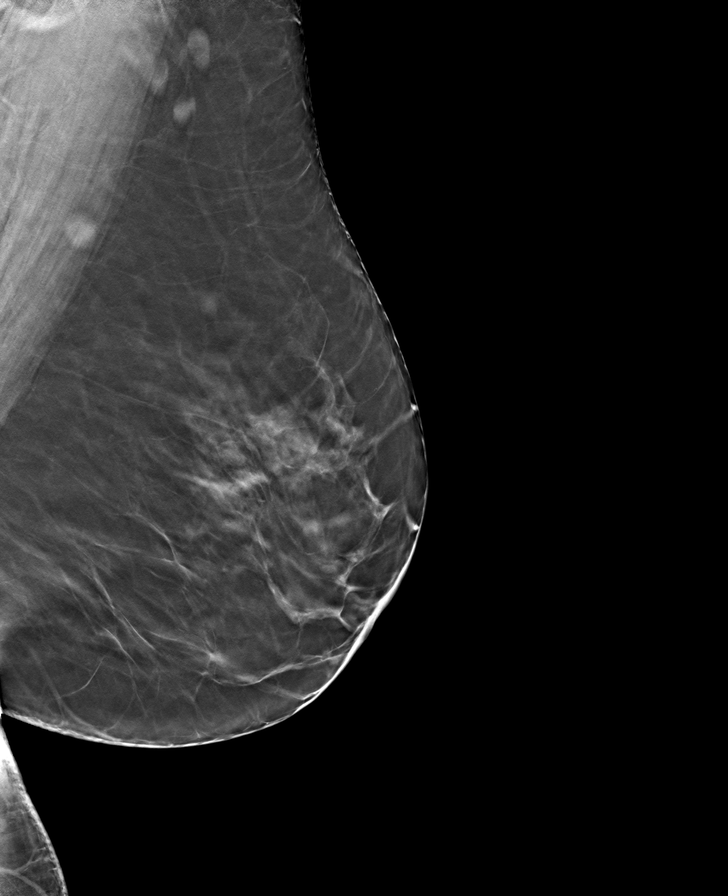

[8 of 24 positions shown; findings below may reference images not displayed]

ACR Breast Density Category b: There are scattered areas of
fibroglandular density.
FINDINGS: There are no findings suspicious for malignancy. Images were
processed with CAD.
IMPRESSION: No mammographic evidence of malignancy. A result letter of this
screening mammogram will be mailed directly to the patient.

RECOMMENDATION:
Screening mammogram in one year. (Code:[33])

BI-RADS CATEGORY  1: Negative.

## 2017-09-11 ENCOUNTER — Other Ambulatory Visit (HOSPITAL_COMMUNITY): Payer: Self-pay | Admitting: Specialist

## 2017-09-11 ENCOUNTER — Ambulatory Visit (HOSPITAL_COMMUNITY)
Admission: RE | Admit: 2017-09-11 | Discharge: 2017-09-11 | Disposition: A | Discharge status: Home / Self Care | Payer: 59 | Source: Ambulatory Visit | Attending: Internal Medicine | Admitting: Internal Medicine

## 2017-09-11 DIAGNOSIS — M7989 Other specified soft tissue disorders: Secondary | ICD-10-CM

## 2017-09-11 DIAGNOSIS — Z86718 Personal history of other venous thrombosis and embolism: Secondary | ICD-10-CM | POA: Diagnosis present

## 2017-09-11 DIAGNOSIS — M79661 Pain in right lower leg: Secondary | ICD-10-CM | POA: Diagnosis present

## 2018-10-17 ENCOUNTER — Other Ambulatory Visit: Payer: Self-pay

## 2018-10-20 ENCOUNTER — Other Ambulatory Visit: Payer: Self-pay

## 2018-10-20 ENCOUNTER — Other Ambulatory Visit (HOSPITAL_COMMUNITY): Payer: Self-pay | Admitting: Obstetrics & Gynecology

## 2018-10-20 ENCOUNTER — Encounter: Payer: Self-pay | Admitting: Family

## 2018-10-20 ENCOUNTER — Ambulatory Visit (INDEPENDENT_AMBULATORY_CARE_PROVIDER_SITE_OTHER): Payer: 59 | Admitting: Family

## 2018-10-20 VITALS — BP 119/65 | HR 80 | Temp 98.4°F | Resp 20 | Ht 63.0 in | Wt 218.0 lb

## 2018-10-20 DIAGNOSIS — Z23 Encounter for immunization: Secondary | ICD-10-CM | POA: Diagnosis not present

## 2018-10-20 DIAGNOSIS — R609 Edema, unspecified: Secondary | ICD-10-CM

## 2018-10-20 DIAGNOSIS — Z1211 Encounter for screening for malignant neoplasm of colon: Secondary | ICD-10-CM | POA: Diagnosis not present

## 2018-10-20 DIAGNOSIS — Z0001 Encounter for general adult medical examination with abnormal findings: Secondary | ICD-10-CM | POA: Diagnosis not present

## 2018-10-20 DIAGNOSIS — Z1231 Encounter for screening mammogram for malignant neoplasm of breast: Secondary | ICD-10-CM

## 2018-10-20 DIAGNOSIS — K5900 Constipation, unspecified: Secondary | ICD-10-CM | POA: Diagnosis not present

## 2018-10-20 DIAGNOSIS — Z0000 Encounter for general adult medical examination without abnormal findings: Secondary | ICD-10-CM

## 2018-10-20 DIAGNOSIS — Z6838 Body mass index (BMI) 38.0-38.9, adult: Secondary | ICD-10-CM

## 2018-10-20 DIAGNOSIS — R600 Localized edema: Secondary | ICD-10-CM

## 2018-10-20 DIAGNOSIS — E6609 Other obesity due to excess calories: Secondary | ICD-10-CM

## 2018-10-20 DIAGNOSIS — Z713 Dietary counseling and surveillance: Secondary | ICD-10-CM

## 2018-10-20 MED ORDER — PHENTERMINE HCL 37.5 MG PO TABS: 37.5000 mg | ORAL_TABLET | Freq: Every day | ORAL | 2 refills | Status: DC

## 2018-10-20 NOTE — Patient Instructions (Signed)
Health Maintenance, Female Adopting a healthy lifestyle and getting preventive care are important in promoting health and wellness. Ask your health care provider about:  The right schedule for you to have regular tests and exams.  Things you can do on your own to prevent diseases and keep yourself healthy. What should I know about diet, weight, and exercise? Eat a healthy diet   Eat a diet that includes plenty of vegetables, fruits, low-fat dairy products, and lean protein.  Do not eat a lot of foods that are high in solid fats, added sugars, or sodium. Maintain a healthy weight Body mass index (BMI) is used to identify weight problems. It estimates body fat based on height and weight. Your health care provider can help determine your BMI and help you achieve or maintain a healthy weight. Get regular exercise Get regular exercise. This is one of the most important things you can do for your health. Most adults should:  Exercise for at least 150 minutes each week. The exercise should increase your heart rate and make you sweat (moderate-intensity exercise).  Do strengthening exercises at least twice a week. This is in addition to the moderate-intensity exercise.  Spend less time sitting. Even light physical activity can be beneficial. Watch cholesterol and blood lipids Have your blood tested for lipids and cholesterol at 51 years of age, then have this test every 5 years. Have your cholesterol levels checked more often if:  Your lipid or cholesterol levels are high.  You are older than 51 years of age.  You are at high risk for heart disease. What should I know about cancer screening? Depending on your health history and family history, you may need to have cancer screening at various ages. This may include screening for:  Breast cancer.  Cervical cancer.  Colorectal cancer.  Skin cancer.  Lung cancer. What should I know about heart disease, diabetes, and high blood  pressure? Blood pressure and heart disease  High blood pressure causes heart disease and increases the risk of stroke. This is more likely to develop in people who have high blood pressure readings, are of African descent, or are overweight.  Have your blood pressure checked: ? Every 3-5 years if you are 18-39 years of age. ? Every year if you are 40 years old or older. Diabetes Have regular diabetes screenings. This checks your fasting blood sugar level. Have the screening done:  Once every three years after age 40 if you are at a normal weight and have a low risk for diabetes.  More often and at a younger age if you are overweight or have a high risk for diabetes. What should I know about preventing infection? Hepatitis B If you have a higher risk for hepatitis B, you should be screened for this virus. Talk with your health care provider to find out if you are at risk for hepatitis B infection. Hepatitis C Testing is recommended for:  Everyone born from 1945 through 1965.  Anyone with known risk factors for hepatitis C. Sexually transmitted infections (STIs)  Get screened for STIs, including gonorrhea and chlamydia, if: ? You are sexually active and are younger than 51 years of age. ? You are older than 51 years of age and your health care provider tells you that you are at risk for this type of infection. ? Your sexual activity has changed since you were last screened, and you are at increased risk for chlamydia or gonorrhea. Ask your health care provider if   you are at risk.  Ask your health care provider about whether you are at high risk for HIV. Your health care provider may recommend a prescription medicine to help prevent HIV infection. If you choose to take medicine to prevent HIV, you should first get tested for HIV. You should then be tested every 3 months for as long as you are taking the medicine. Pregnancy  If you are about to stop having your period (premenopausal) and  you may become pregnant, seek counseling before you get pregnant.  Take 400 to 800 micrograms (mcg) of folic acid every day if you become pregnant.  Ask for birth control (contraception) if you want to prevent pregnancy. Osteoporosis and menopause Osteoporosis is a disease in which the bones lose minerals and strength with aging. This can result in bone fractures. If you are 65 years old or older, or if you are at risk for osteoporosis and fractures, ask your health care provider if you should:  Be screened for bone loss.  Take a calcium or vitamin D supplement to lower your risk of fractures.  Be given hormone replacement therapy (HRT) to treat symptoms of menopause. Follow these instructions at home: Lifestyle  Do not use any products that contain nicotine or tobacco, such as cigarettes, e-cigarettes, and chewing tobacco. If you need help quitting, ask your health care provider.  Do not use street drugs.  Do not share needles.  Ask your health care provider for help if you need support or information about quitting drugs. Alcohol use  Do not drink alcohol if: ? Your health care provider tells you not to drink. ? You are pregnant, may be pregnant, or are planning to become pregnant.  If you drink alcohol: ? Limit how much you use to 0-1 drink a day. ? Limit intake if you are breastfeeding.  Be aware of how much alcohol is in your drink. In the [place] one drink equals one 12 oz bottle of beer (355 mL), one 5 oz glass of wine (148 mL), or one 1 oz glass of hard liquor (44 mL). General instructions  Schedule regular health, dental, and eye exams.  Stay current with your vaccines.  Tell your health care provider if: ? You often feel depressed. ? You have ever been abused or do not feel safe at home. Summary  Adopting a healthy lifestyle and getting preventive care are important in promoting health and wellness.  Follow your health care provider's instructions about healthy  diet, exercising, and getting tested or screened for diseases.  Follow your health care provider's instructions on monitoring your cholesterol and blood pressure. This information is not intended to replace advice given to you by your health care provider. Make sure you discuss any questions you have with your health care provider. Document Released: 08/07/2010 Document Revised: 01/15/2018 Document Reviewed: 01/15/2018 Elsevier Patient Education  2020 Elsevier Inc.  

## 2018-10-20 NOTE — Progress Notes (Signed)
Subjective:    Patient ID: Kim Reed, female    DOB: 1965-07-06, 53 y.o.   MRN: [id number]  Chief Complaint  Patient presents with  . Annual Exam   Pt presents to the office today for CPE without pap. She is followed by GYN annually for her paps. PT is currently only taking a multivitamin. Pt denies any headache, palpitations, SOB, or edema at this time.   She reports she feels like she is "retaining fluid". She feels like her right knee and leg is larger than her left leg. She reports she fell off a horse about 4-5 years ago and then again last summer. She has had 2 MRI's on her right knee and dopplers completed that were negative for DVT. She was told she had osteoarthritis. Her last weight was 213 lb from 02/09/16 her weight today is 218 lbs.    Constipation This is a chronic problem. The current episode started more than 1 year ago. The problem has been resolved since onset. Her stool frequency is 1 time per day. She has tried laxatives for the symptoms. The treatment provided moderate relief.      Review of Systems  Gastrointestinal: Positive for constipation.  All other systems reviewed and are negative.   Family History  Problem Relation Age of Onset  . Thyroid disease Mother   . Cancer Father        prostate  . Thyroid disease Father   . Asthma Sister   . Other Sister        had heavy periods  . GER disease Brother   . Cancer Paternal Grandmother   . Alzheimer's disease Maternal Grandmother   . Heart attack Maternal Grandfather   . Breast cancer Other   . Colon cancer Other    Social History   Socioeconomic History  . Marital status: Divorced    Spouse name: Not on file  . Number of children: Not on file  . Years of education: Not on file  . Highest education level: Not on file  Occupational History    Employer: [employer]  Social Needs  . Financial resource strain: Not on file  . Food insecurity    Worry: Not on file   Inability: Not on file  . Transportation needs    Medical: Not on file    Non-medical: Not on file  Tobacco Use  . Smoking status: Former Smoker    Types: Cigarettes    Quit date: 02/18/2008    Years since quitting: 10.6  . Smokeless tobacco: Never Used  Substance and Sexual Activity  . Alcohol use: Yes    Alcohol/week: 0.0 standard drinks    Comment: socially  . Drug use: No  . Sexual activity: Yes    Birth control/protection: Surgical    Comment: tubal  Lifestyle  . Physical activity    Days per week: Not on file    Minutes per session: Not on file  . Stress: Not on file  Relationships  . Social Herbalist on phone: Not on file    Gets together: Not on file    Attends religious service: Not on file    Active member of club or organization: Not on file    Attends meetings of clubs or organizations: Not on file    Relationship status: Not on file  Other Topics Concern  . Not on file  Social History Narrative  . Not on file  Objective:   Physical Exam Vitals signs reviewed.  Constitutional:      General: She is not in acute distress.    Appearance: She is well-developed. She is obese.  HENT:     Head: Normocephalic and atraumatic.     Right Ear: Tympanic membrane normal.     Left Ear: Tympanic membrane normal.  Eyes:     Pupils: Pupils are equal, round, and reactive to light.  Neck:     Musculoskeletal: Normal range of motion and neck supple.     Thyroid: No thyromegaly.  Cardiovascular:     Rate and Rhythm: Normal rate and regular rhythm.     Heart sounds: Normal heart sounds. No murmur.  Pulmonary:     Effort: Pulmonary effort is normal. No respiratory distress.     Breath sounds: Normal breath sounds. No wheezing.  Abdominal:     General: Bowel sounds are normal. There is no distension.     Palpations: Abdomen is soft.     Tenderness: There is no abdominal tenderness.  Musculoskeletal: Normal range of motion.        General: No  tenderness.     Right lower leg: Edema (trace) present.  Skin:    General: Skin is warm and dry.  Neurological:     Mental Status: She is alert and oriented to person, place, and time.     Cranial Nerves: No cranial nerve deficit.     Deep Tendon Reflexes: Reflexes are normal and symmetric.  Psychiatric:        Behavior: Behavior normal.        Thought Content: Thought content normal.        Judgment: Judgment normal.       BP 119/65   Pulse 80   Temp 98.4 F (36.9 C) (Temporal)   Resp 20   Ht 5' 3" (1.6 m)   Wt 218 lb (98.9 kg)   LMP 10/20/2018   SpO2 98%   BMI 38.62 kg/m      Assessment & Plan:  Kim Reed comes in today with chief complaint of Annual Exam   Diagnosis and orders addressed:  1. Annual physical exam - CMP14+EGFR - CBC with Differential/Platelet - Lipid panel - TSH  2. Class 2 obesity due to excess calories without serious comorbidity with body mass index (BMI) of 38.0 to 38.9 in adult - CMP14+EGFR - CBC with Differential/Platelet - phentermine (ADIPEX-P) 37.5 MG tablet; Take 1 tablet (37.5 mg total) by mouth daily before breakfast.  Dispense: 30 tablet; Refill: 2  3. Constipation, unspecified constipation type - CMP14+EGFR - CBC with Differential/Platelet  4. Colon cancer screening - Ambulatory referral to Gastroenterology - CMP14+EGFR - CBC with Differential/Platelet  5. Weight loss counseling, encounter for -Will give phentermine today Encouraged healthy diet and exercise Must lost 5% of her weight to continue medication  RTO in 3 months  - phentermine (ADIPEX-P) 37.5 MG tablet; Take 1 tablet (37.5 mg total) by mouth daily before breakfast.  Dispense: 30 tablet; Refill: 2  6. Peripheral edema Low salt diet Compression hose  Keep elevated when possible  - Compression stockings   Labs pending Health Maintenance reviewed-TDAP and Flu given today. She has mammogram scheduled.  Diet and exercise encouraged  Follow up plan:  3 months    Evelina Dun, FNP

## 2018-10-21 ENCOUNTER — Other Ambulatory Visit: Payer: 59

## 2018-10-21 ENCOUNTER — Other Ambulatory Visit: Payer: Self-pay

## 2018-10-22 LAB — CBC WITH DIFFERENTIAL/PLATELET
Basophils Absolute: 0.1 10*3/uL (ref 0.0–0.2)
Basos: 1 %
EOS (ABSOLUTE): 0.1 10*3/uL (ref 0.0–0.4)
Eos: 1 %
Hematocrit: 39.3 % (ref 34.0–46.6)
Hemoglobin: 13 g/dL (ref 11.1–15.9)
Immature Grans (Abs): 0.1 10*3/uL (ref 0.0–0.1)
Immature Granulocytes: 1 %
Lymphocytes Absolute: 2 10*3/uL (ref 0.7–3.1)
Lymphs: 22 %
MCH: 31.1 pg (ref 26.6–33.0)
MCHC: 33.1 g/dL (ref 31.5–35.7)
MCV: 94 fL (ref 79–97)
Monocytes Absolute: 0.7 10*3/uL (ref 0.1–0.9)
Monocytes: 8 %
Neutrophils Absolute: 6.1 10*3/uL (ref 1.4–7.0)
Neutrophils: 67 %
Platelets: 302 10*3/uL (ref 150–450)
RBC: 4.18 x10E6/uL (ref 3.77–5.28)
RDW: 12.7 % (ref 11.7–15.4)
WBC: 9.1 10*3/uL (ref 3.4–10.8)

## 2018-10-22 LAB — CMP14+EGFR
ALT: 12 IU/L (ref 0–32)
AST: 16 IU/L (ref 0–40)
Albumin/Globulin Ratio: 1.5 (ref 1.2–2.2)
Albumin: 4 g/dL (ref 3.8–4.9)
Alkaline Phosphatase: 121 IU/L — ABNORMAL HIGH (ref 39–117)
BUN/Creatinine Ratio: 14 (ref 9–23)
BUN: 11 mg/dL (ref 6–24)
Bilirubin Total: 0.5 mg/dL (ref 0.0–1.2)
CO2: 23 mmol/L (ref 20–29)
Calcium: 8.7 mg/dL (ref 8.7–10.2)
Chloride: 104 mmol/L (ref 96–106)
Creatinine, Ser: 0.78 mg/dL (ref 0.57–1.00)
GFR calc Af Amer: 102 mL/min/{1.73_m2} (ref >59)
GFR calc non Af Amer: 88 mL/min/{1.73_m2} (ref >59)
Globulin, Total: 2.7 g/dL (ref 1.5–4.5)
Glucose: 99 mg/dL (ref 65–99)
Potassium: 4.1 mmol/L (ref 3.5–5.2)
Sodium: 139 mmol/L (ref 134–144)
Total Protein: 6.7 g/dL (ref 6.0–8.5)

## 2018-10-22 LAB — LIPID PANEL
Chol/HDL Ratio: 4.2 ratio (ref 0.0–4.4)
Cholesterol, Total: 179 mg/dL (ref 100–199)
HDL: 43 mg/dL (ref >39)
LDL Chol Calc (NIH): 114 mg/dL — ABNORMAL HIGH (ref 0–99)
Triglycerides: 123 mg/dL (ref 0–149)
VLDL Cholesterol Cal: 22 mg/dL (ref 5–40)

## 2018-10-22 LAB — TSH: TSH: 3.03 u[IU]/mL (ref 0.450–4.500)

## 2018-10-23 ENCOUNTER — Other Ambulatory Visit: Payer: Self-pay | Admitting: Family

## 2018-10-24 ENCOUNTER — Telehealth: Payer: Self-pay

## 2018-10-24 NOTE — Telephone Encounter (Signed)
Referral was already reviewed and sent to [facility], pt needs an ov prior to ov due to constipation

## 2018-10-24 NOTE — Telephone Encounter (Signed)
Pt was referred by Evelina Dun and wants to schedule her procedure. Pt is aware that our office will contact her about the referral when received and approved by a provider. Pt's contact info. [phone]

## 2018-10-28 ENCOUNTER — Encounter: Payer: Self-pay | Admitting: Gastroenterology

## 2018-11-03 NOTE — Telephone Encounter (Addendum)
Per the patient she doesn't have constipation.  She goes to the bathroom daily.  I will schedule her for a nurse visit.

## 2018-11-10 ENCOUNTER — Ambulatory Visit (HOSPITAL_COMMUNITY)
Admission: RE | Admit: 2018-11-10 | Discharge: 2018-11-10 | Disposition: A | Discharge status: Home / Self Care | Payer: 59 | Source: Ambulatory Visit | Attending: Obstetrics & Gynecology | Admitting: Obstetrics & Gynecology

## 2018-11-10 ENCOUNTER — Other Ambulatory Visit: Payer: Self-pay

## 2018-11-10 DIAGNOSIS — Z1231 Encounter for screening mammogram for malignant neoplasm of breast: Secondary | ICD-10-CM

## 2018-11-10 IMAGING — MG MM DIGITAL SCREENING BILAT W/ TOMO W/ CAD
8 series · 8 of 24 positions shown · non-contrast
Comparison: Previous exam(s).

CLINICAL DATA: Screening.

EXAM:
DIGITAL SCREENING BILATERAL MAMMOGRAM WITH TOMO AND CAD

[R MLO synth-2D]
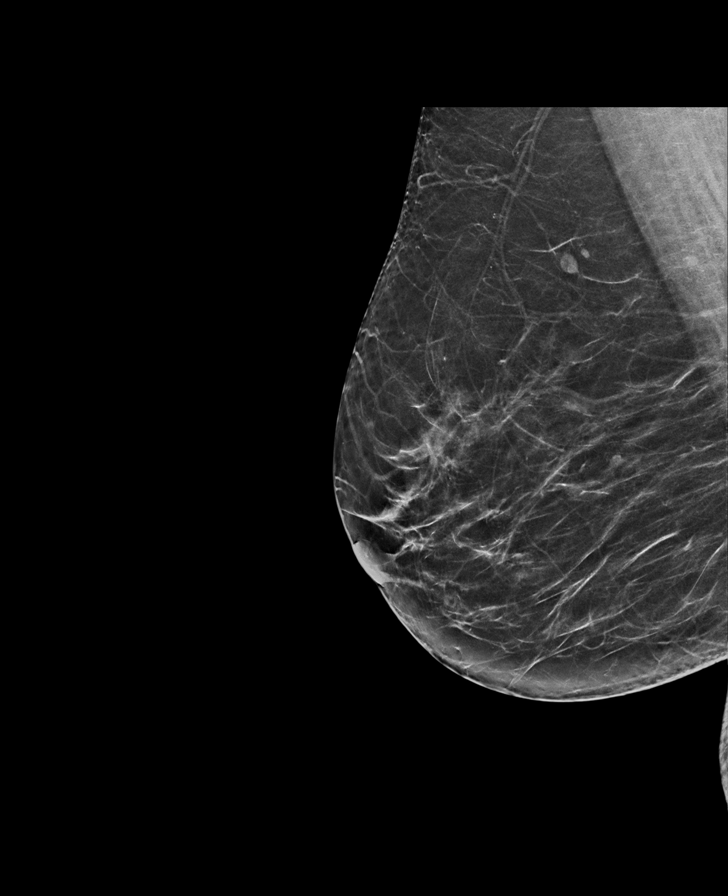

[L MLO synth-2D]
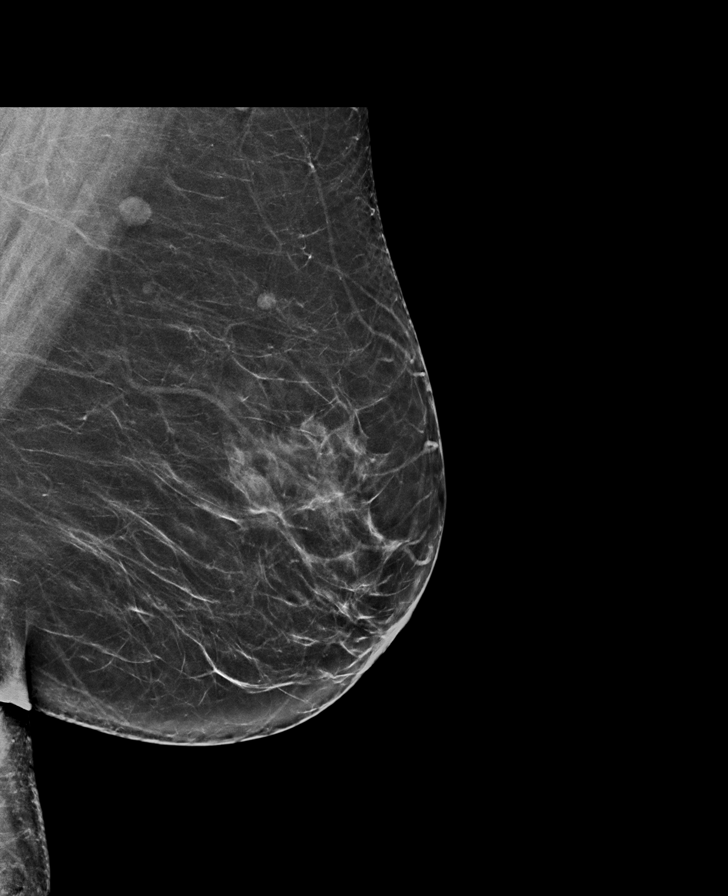

[R CC synth-2D]
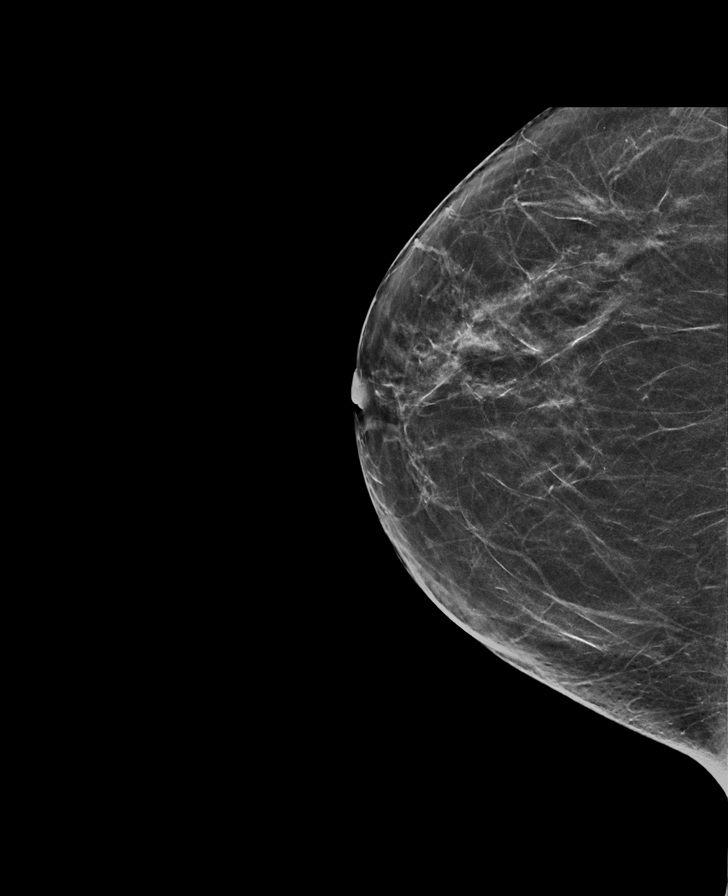

[L CC synth-2D]
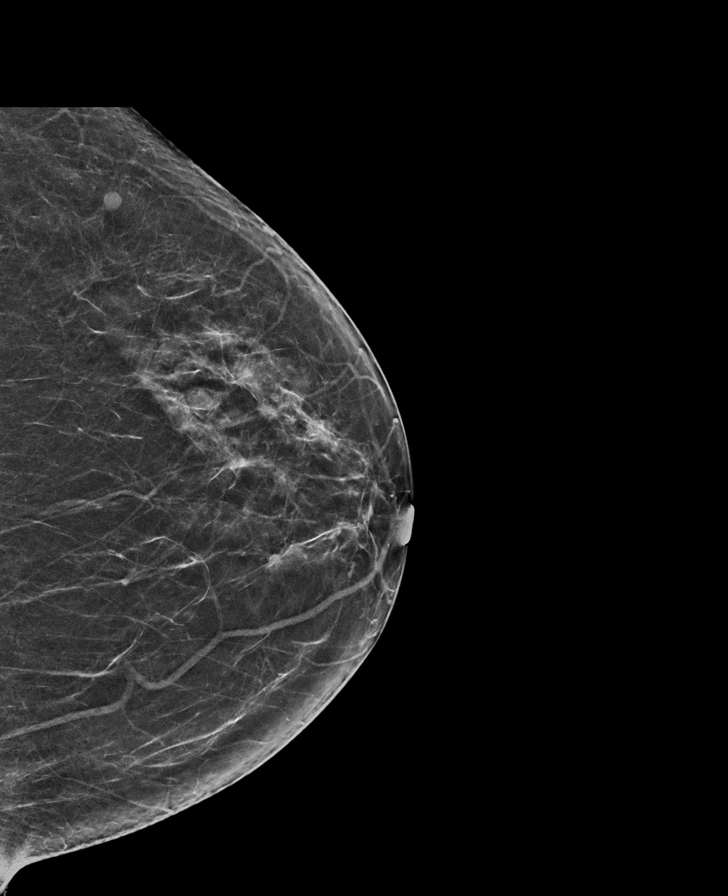

[R MLO tomo · tomo slice 35/69.0]
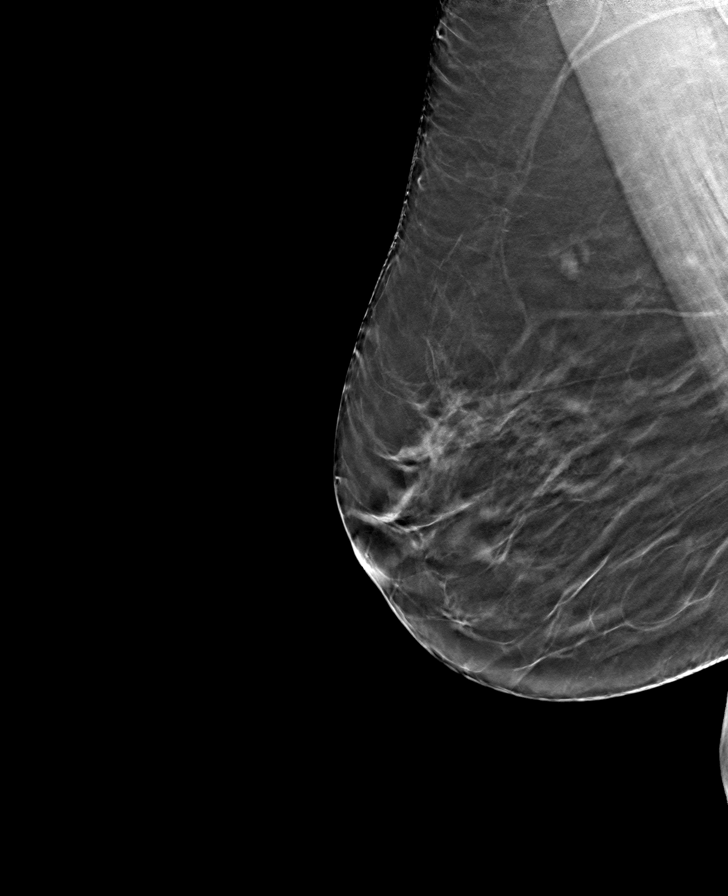

[R CC tomo · tomo slice 32/63.0]
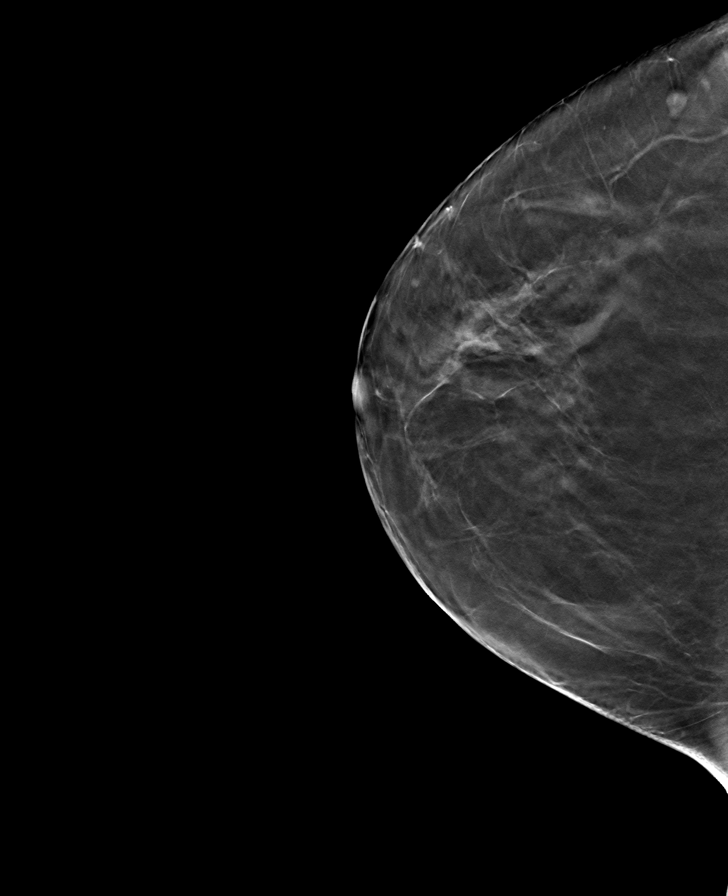

[L CC tomo · tomo slice 32/63.0]
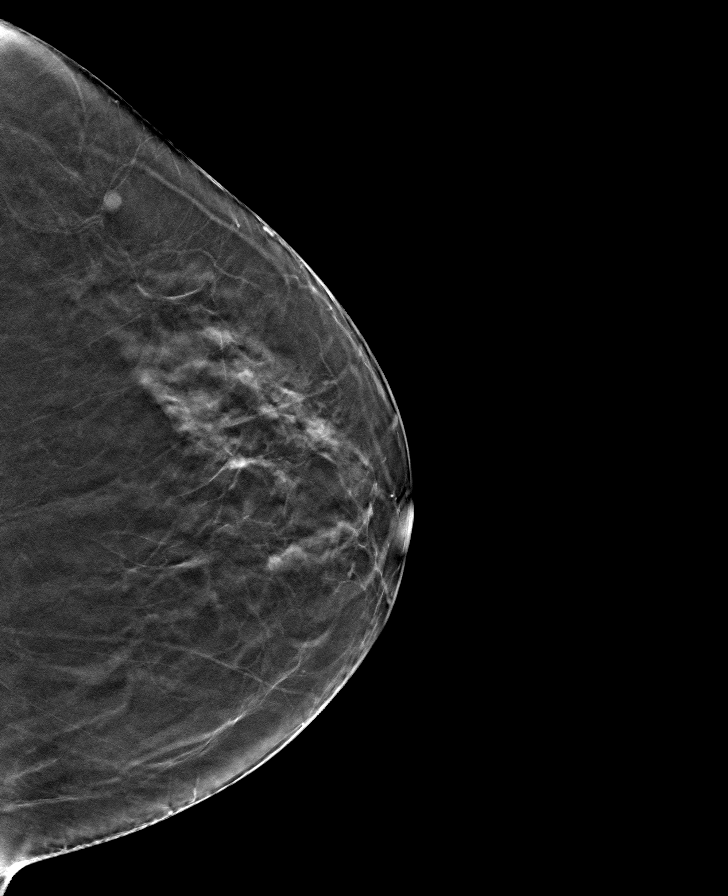

[L MLO tomo · tomo slice 37/74.0]
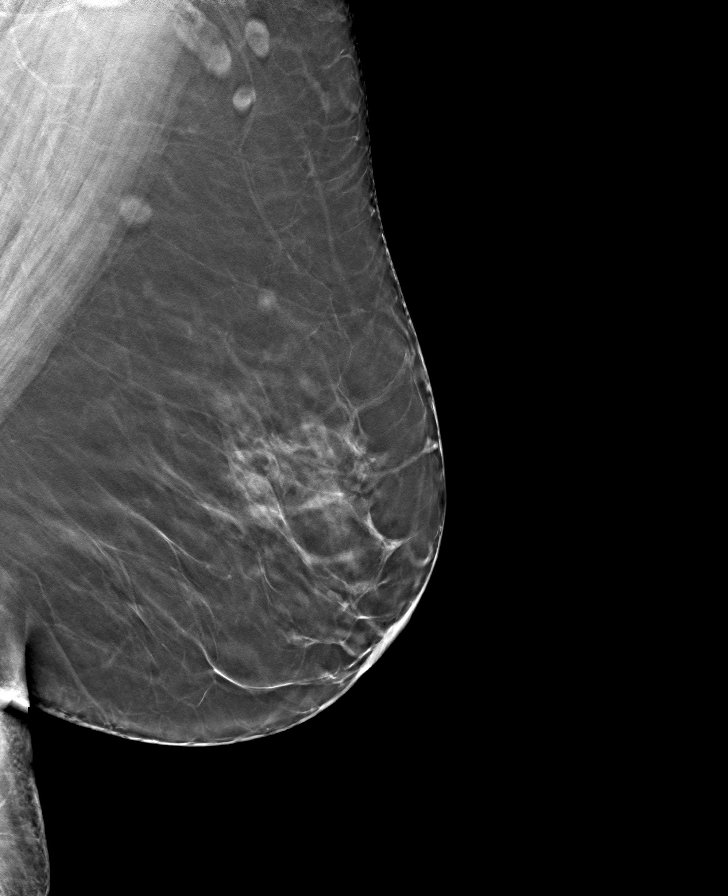

[8 of 24 positions shown; findings below may reference images not displayed]

ACR Breast Density Category b: There are scattered areas of
fibroglandular density.
FINDINGS: In the left breast, a possible mass warrants further evaluation. In
the right breast, no findings suspicious for malignancy.

Images were processed with CAD.
IMPRESSION: Further evaluation is suggested for possible mass in the left
breast.

RECOMMENDATION:
Diagnostic mammogram and possibly ultrasound of the left breast.
(Code:[R1])

The patient will be contacted regarding the findings, and additional
imaging will be scheduled.

BI-RADS CATEGORY  0: Incomplete. Need additional imaging evaluation
and/or prior mammograms for comparison.

## 2018-11-12 ENCOUNTER — Other Ambulatory Visit (HOSPITAL_COMMUNITY): Payer: Self-pay | Admitting: Obstetrics & Gynecology

## 2018-11-12 DIAGNOSIS — R928 Other abnormal and inconclusive findings on diagnostic imaging of breast: Secondary | ICD-10-CM

## 2018-11-19 ENCOUNTER — Other Ambulatory Visit: Payer: Self-pay

## 2018-11-19 ENCOUNTER — Ambulatory Visit (HOSPITAL_COMMUNITY)
Admission: RE | Admit: 2018-11-19 | Discharge: 2018-11-19 | Disposition: A | Discharge status: Home / Self Care | Payer: 59 | Source: Ambulatory Visit | Attending: Obstetrics & Gynecology | Admitting: Obstetrics & Gynecology

## 2018-11-19 DIAGNOSIS — R928 Other abnormal and inconclusive findings on diagnostic imaging of breast: Secondary | ICD-10-CM | POA: Insufficient documentation

## 2018-11-19 IMAGING — MG MM DIGITAL DIAGNOSTIC UNILAT*L* W/ TOMO W/ CAD
4 series · 4 of 12 positions shown · non-contrast
Comparison: Previous exam(s).

CLINICAL DATA: Possible mass in the outer left breast on a recent
screening mammogram.

EXAM:
DIGITAL DIAGNOSTIC LEFT MAMMOGRAM WITH TOMO
ULTRASOUND LEFT BREAST

[L CC synth-2D]
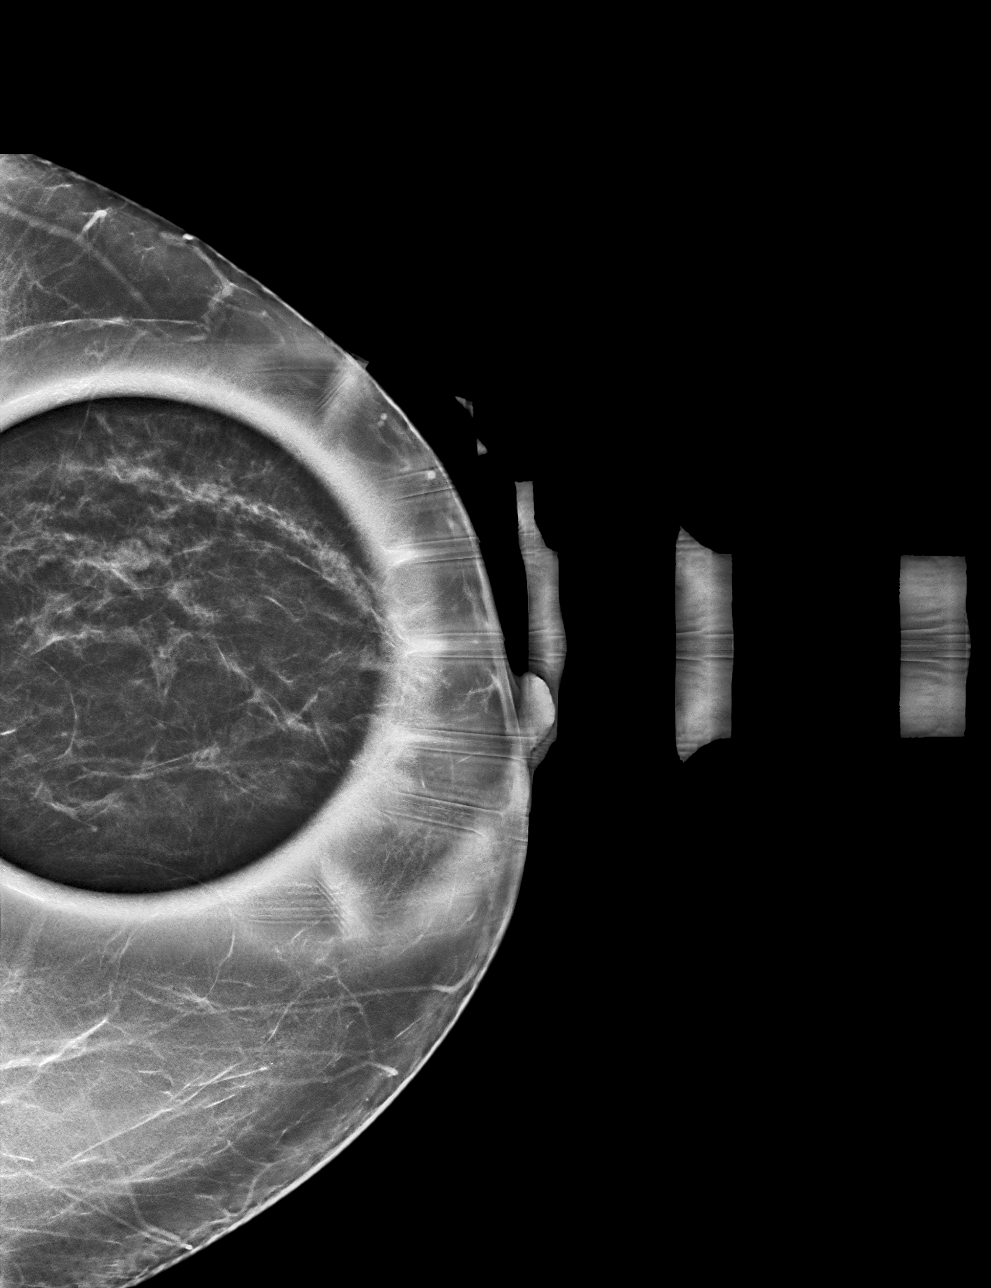

[L MLO synth-2D]
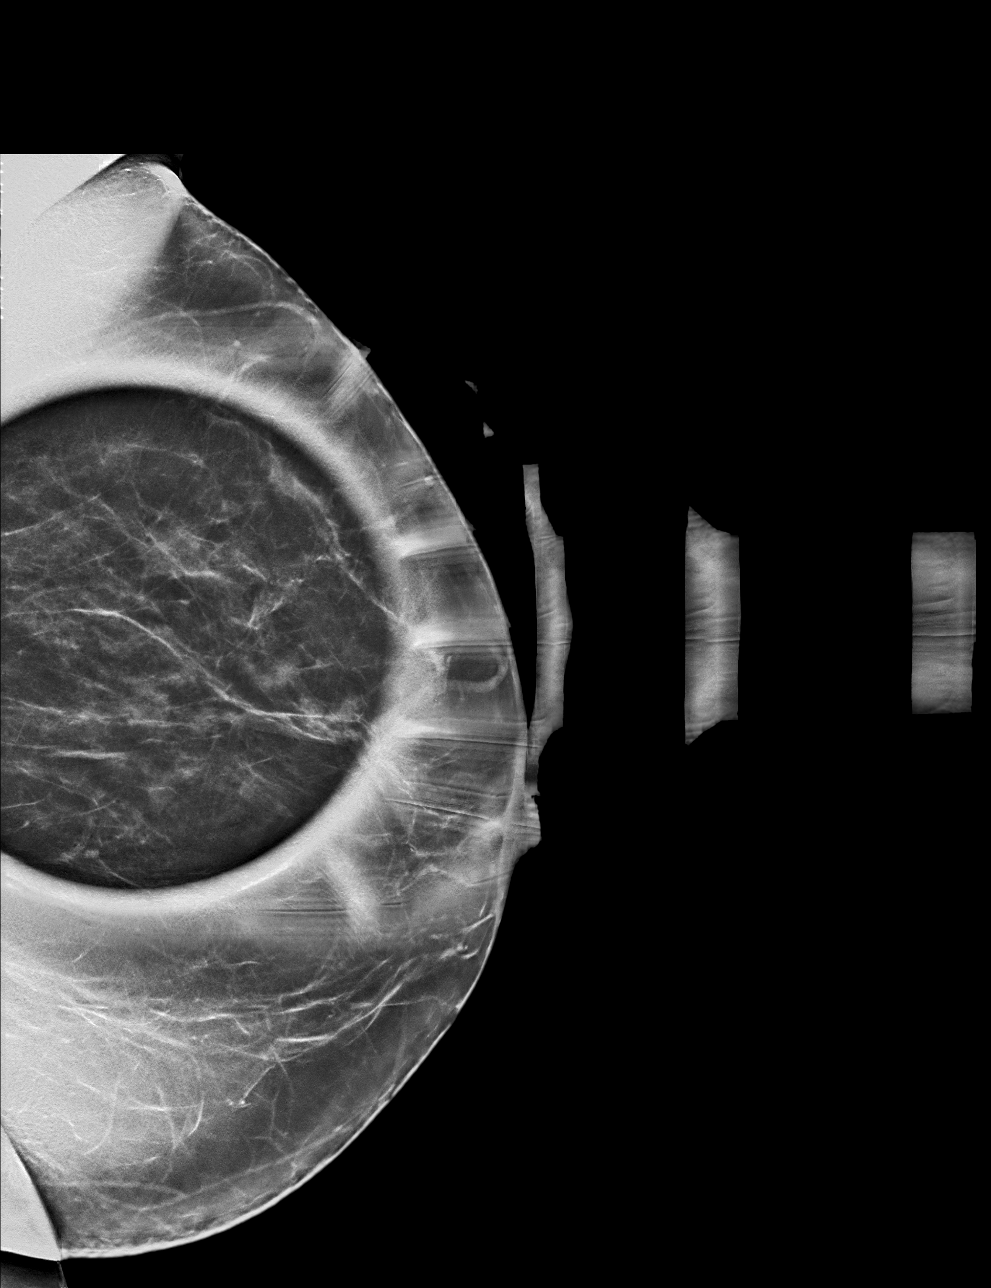

[L MLO tomo · tomo slice 27/54.0]
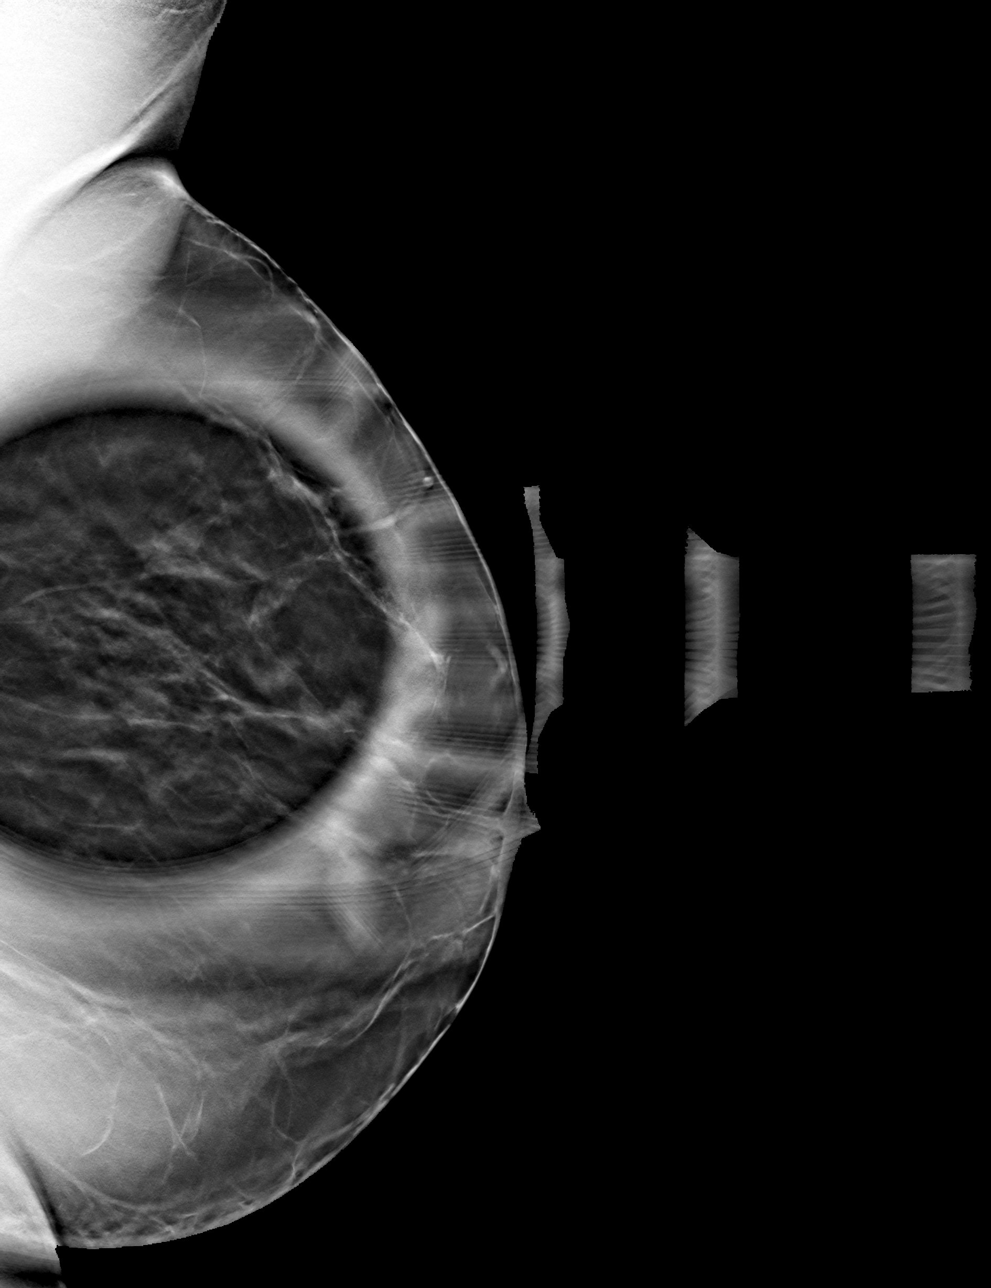

[L CC tomo · tomo slice 25/49.0]
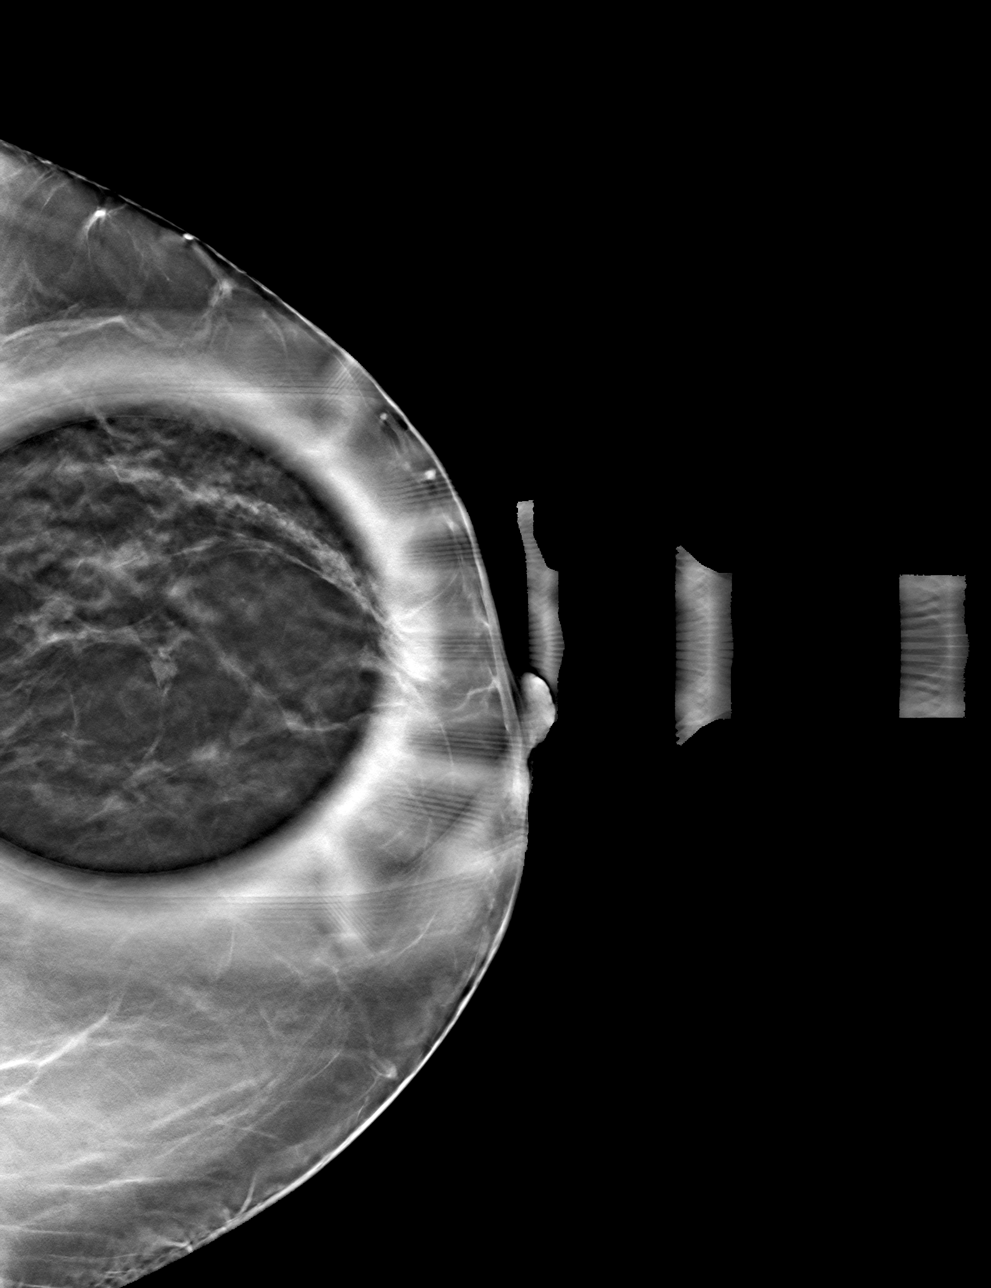

[4 of 12 positions shown; findings below may reference images not displayed]

ACR Breast Density Category b: There are scattered areas of
fibroglandular density.
FINDINGS: 3D tomographic and 2D generated spot compression views of the left
breast confirm a 5 mm rounded, circumscribed mass in the outer left
breast in the middle 3rd.

On physical exam, no mass is palpable in the outer left breast.

Targeted ultrasound is performed, showing an 8 mm cyst containing
low-level internal echoes in the 3 o'clock position of the left
breast, 2 cm from the nipple. No internal blood flow was seen with
power Doppler. This corresponds to the mammographic mass.
IMPRESSION: 8 mm benign left breast cyst.  No evidence of malignancy.

RECOMMENDATION:
Bilateral screening mammogram in 1 year.

I have discussed the findings and recommendations with the patient.
If applicable, a reminder letter will be sent to the patient
regarding the next appointment.

BI-RADS CATEGORY  2: Benign.

## 2018-11-19 IMAGING — US US BREAST*L* LIMITED INC AXILLA
1 series · 12 of 12 positions shown · non-contrast
Comparison: Previous exam(s).

CLINICAL DATA: Possible mass in the outer left breast on a recent
screening mammogram.

EXAM:
DIGITAL DIAGNOSTIC LEFT MAMMOGRAM WITH TOMO
ULTRASOUND LEFT BREAST

[Series 1: us breast*left* limited inc axilla · 0.07mm/px · 12 of 12 slices shown]
[im 1/12]
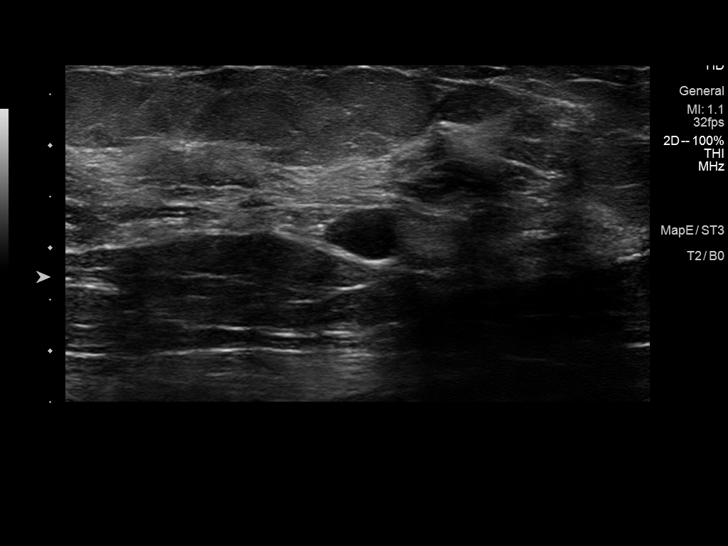
[im 2/12]
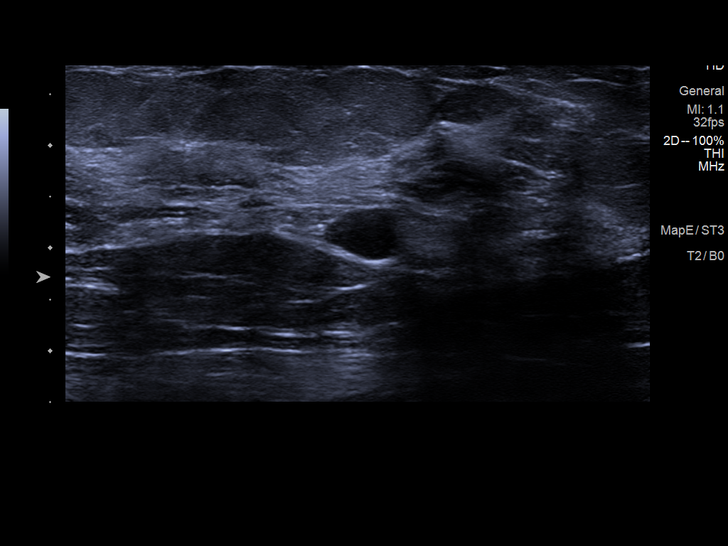
[im 3/12]
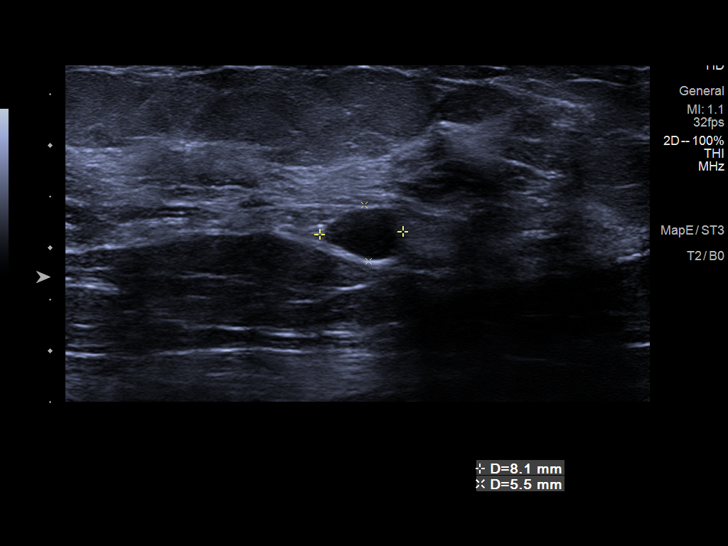
[im 4/12]
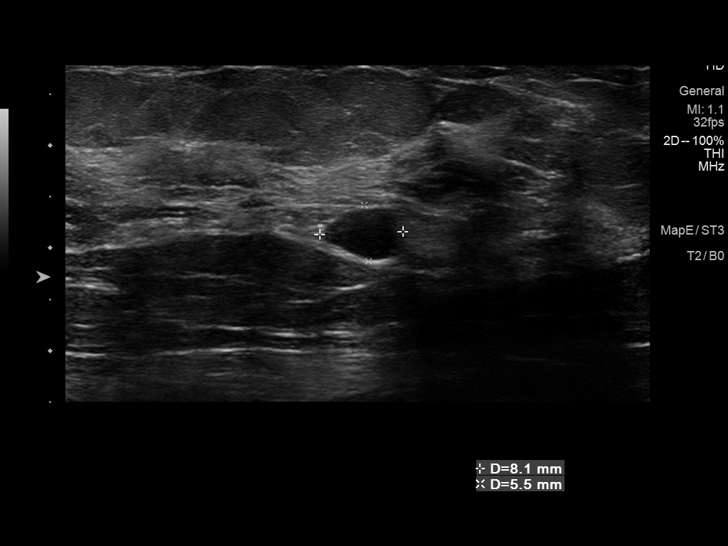
[im 5/12]
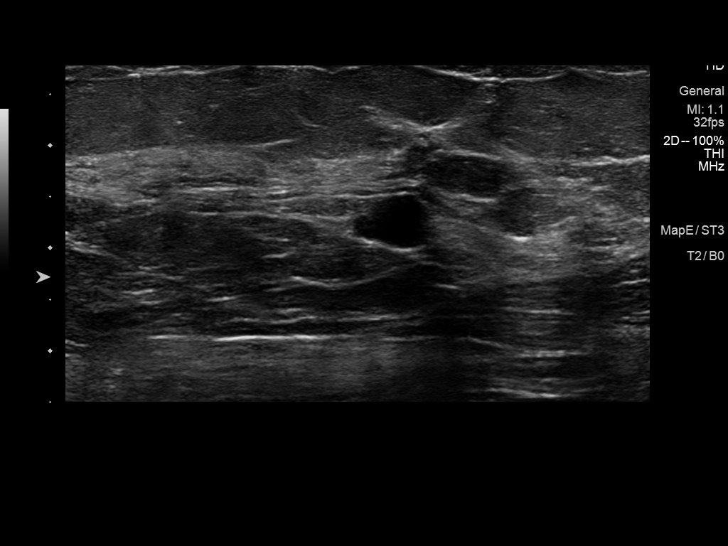
[im 6/12]
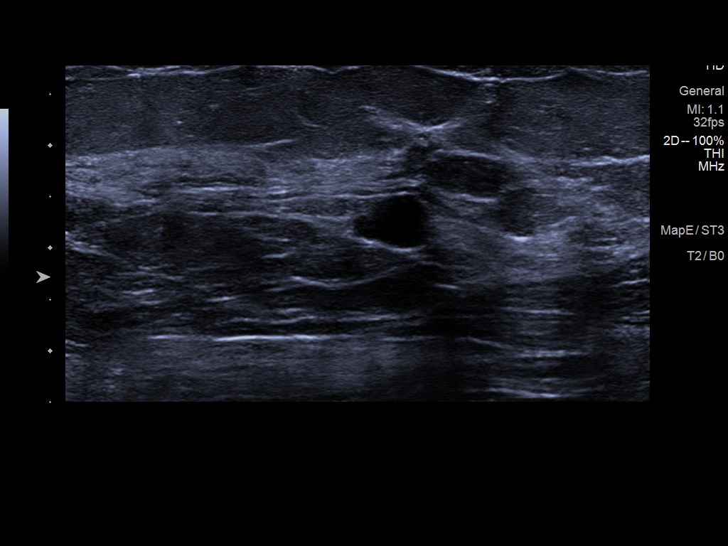
[im 7/12]
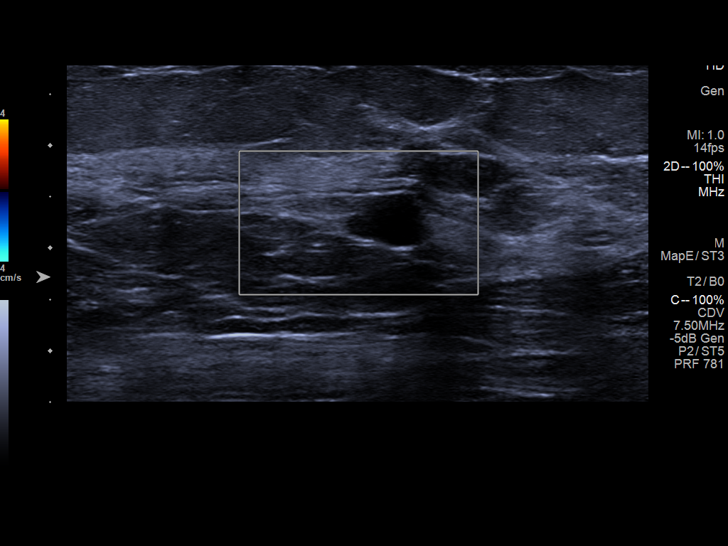
[im 8/12]
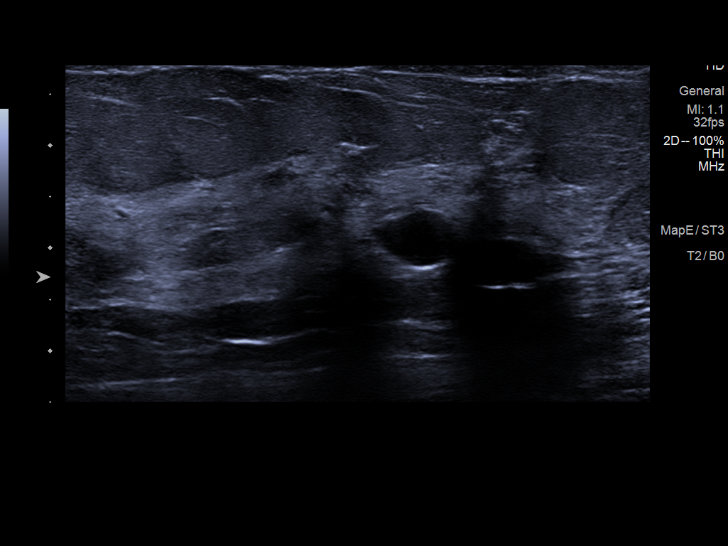
[im 9/12]
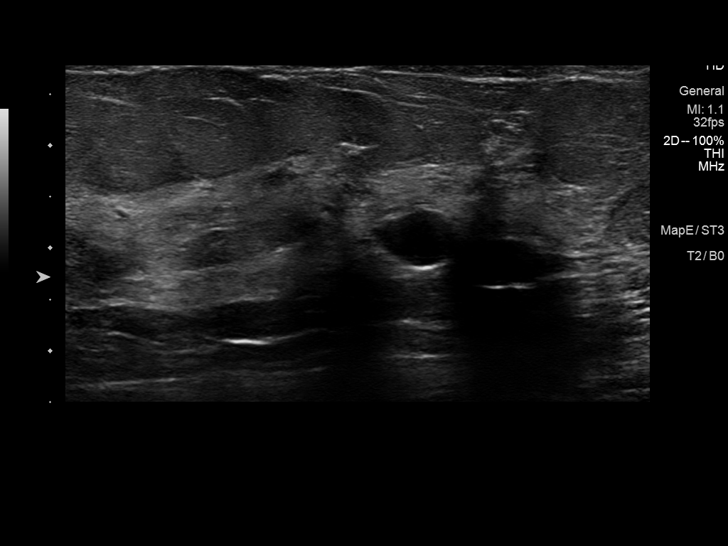
[im 10/12]
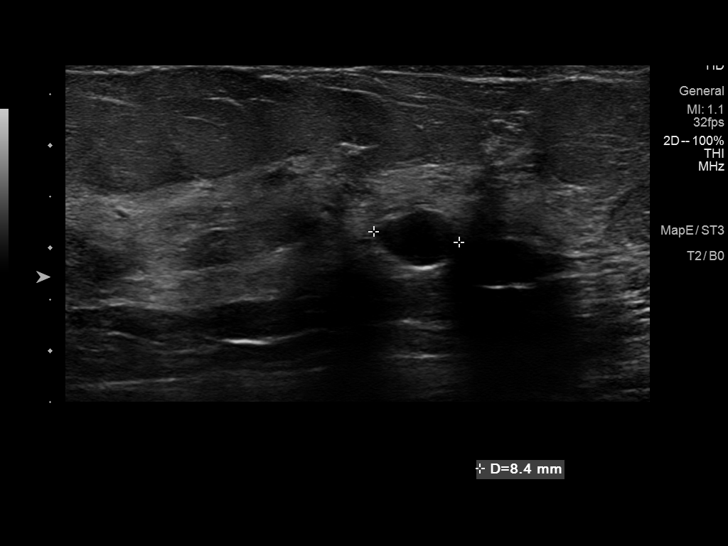
[im 11/12]
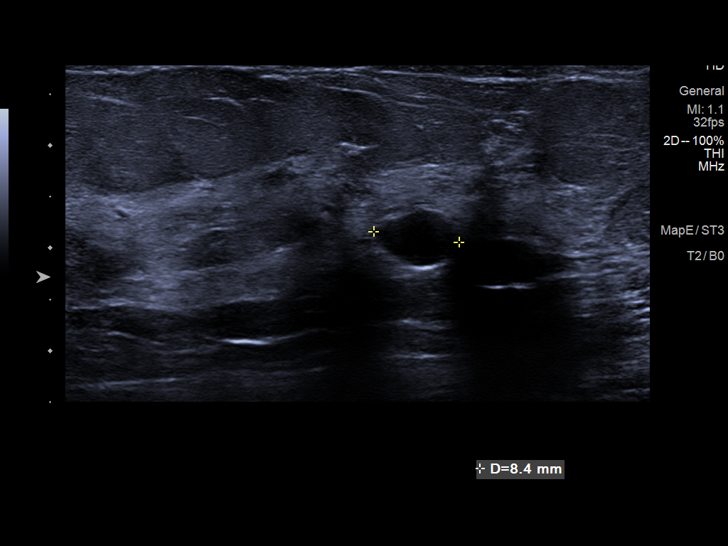
[im 12/12]
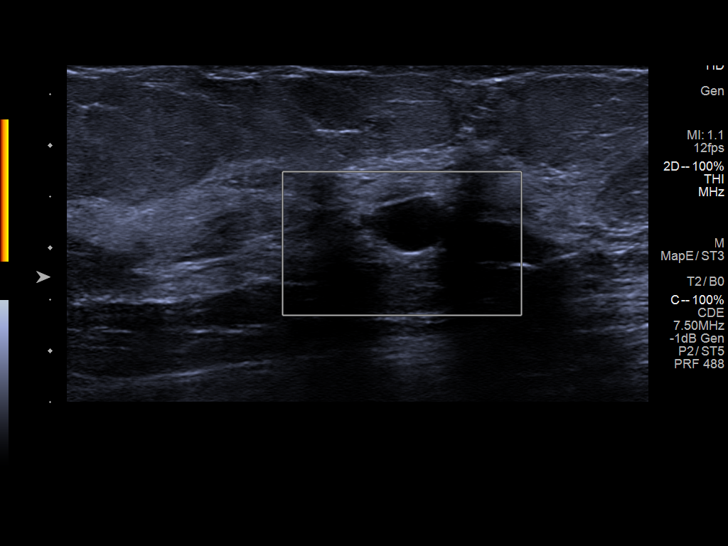

[12 of 12 positions shown; findings below may reference images not displayed]

ACR Breast Density Category b: There are scattered areas of
fibroglandular density.
FINDINGS: 3D tomographic and 2D generated spot compression views of the left
breast confirm a 5 mm rounded, circumscribed mass in the outer left
breast in the middle 3rd.

On physical exam, no mass is palpable in the outer left breast.

Targeted ultrasound is performed, showing an 8 mm cyst containing
low-level internal echoes in the 3 o'clock position of the left
breast, 2 cm from the nipple. No internal blood flow was seen with
power Doppler. This corresponds to the mammographic mass.
IMPRESSION: 8 mm benign left breast cyst.  No evidence of malignancy.

RECOMMENDATION:
Bilateral screening mammogram in 1 year.

I have discussed the findings and recommendations with the patient.
If applicable, a reminder letter will be sent to the patient
regarding the next appointment.

BI-RADS CATEGORY  2: Benign.

## 2018-12-08 ENCOUNTER — Other Ambulatory Visit: Payer: Self-pay

## 2018-12-08 ENCOUNTER — Ambulatory Visit (INDEPENDENT_AMBULATORY_CARE_PROVIDER_SITE_OTHER): Payer: Self-pay | Admitting: *Deleted

## 2018-12-08 DIAGNOSIS — Z1211 Encounter for screening for malignant neoplasm of colon: Secondary | ICD-10-CM

## 2018-12-08 MED ORDER — NA SULFATE-K SULFATE-MG SULF 17.5-3.13-1.6 GM/177ML PO SOLN: 1.0000 | Freq: Once | ORAL | 0 refills | Status: AC

## 2018-12-08 NOTE — Patient Instructions (Signed)
Kim Reed  21-Jan-1966 MRN: [id number]     Procedure Date: 02/02/2019 Time to register: 12:30 pm Place to register: [facility] Procedure Time: 1:30 pm Scheduled provider: Dr. Oneida Alar    PREPARATION FOR COLONOSCOPY WITH SUPREP BOWEL PREP KIT  Note: Suprep Bowel Prep Kit is a split-dose (2day) regimen. Consumption of BOTH 6-ounce bottles is required for a complete prep.  Please notify us immediately if you are diabetic, take iron supplements, or if you are on Coumadin or any other blood thinners.                                                                                                                                                 1 DAY BEFORE PROCEDURE:  DATE: 02/01/2019   DAY: Sunday Continue clear liquids the entire day - NO SOLID FOOD.    At 6:00pm: Complete steps 1 through 4 below, using ONE (1) 6-ounce bottle, before going to bed. Step 1:  Pour ONE (1) 6-ounce bottle of SUPREP liquid into the mixing container.  Step 2:  Add cool drinking water to the 16 ounce line on the container and mix.  Note: Dilute the solution concentrate as directed prior to use. Step 3:  DRINK ALL the liquid in the container. Step 4:  You MUST drink an additional two (2) or more 16 ounce containers of water over the next one (1) hour.   Continue clear liquids.  DAY OF PROCEDURE:   DATE: 02/02/2019  DAY: Monday If you take medications for your heart, blood pressure, or breathing, you may take these medications.    5 hours before your procedure at : 8:30 am  Step 1:  Pour ONE (1) 6-ounce bottle of SUPREP liquid into the mixing container.  Step 2:  Add cool drinking water to the 16 ounce line on the container and mix.  Note: Dilute the solution concentrate as directed prior to use. Step 3:  DRINK ALL the liquid in the container. Step 4:  You MUST drink an additional two (2) or more 16 ounce containers of water over the next one (1) hour. You MUST complete the final glass of  water at least 3 hours before your colonoscopy. Nothing by mouth past 10:30 am  You may take your morning medications with sip of water unless we have instructed otherwise.    Please see below for Dietary Information.  CLEAR LIQUIDS INCLUDE:  Water Jello (NOT red in color)   Ice Popsicles (NOT red in color)   Tea (sugar ok, no milk/cream) Powdered fruit flavored drinks  Coffee (sugar ok, no milk/cream) Gatorade/ Lemonade/ Kool-Aid  (NOT red in color)   Juice: apple, white grape, white cranberry Soft drinks  Clear bullion, consomme, broth (fat free beef/chicken/vegetable)  Carbonated beverages (any kind)  Strained chicken noodle soup Hard Candy   Remember: Clear liquids  are liquids that will allow you to see your fingers on the other side of a clear glass. Be sure liquids are NOT red in color, and not cloudy, but CLEAR.  DO NOT EAT OR DRINK ANY OF THE FOLLOWING:  Dairy products of any kind   Cranberry juice Tomato juice / V8 juice   Grapefruit juice Orange juice     Red grape juice  Do not eat any solid foods, including such foods as: cereal, oatmeal, yogurt, fruits, vegetables, creamed soups, eggs, bread, crackers, pureed foods in a blender, etc.   HELPFUL HINTS FOR DRINKING PREP SOLUTION:   Make sure prep is extremely cold. Mix and refrigerate the the morning of the prep. You may also put in the freezer.   You may try mixing some Crystal Light or Country Time Lemonade if you prefer. Mix in small amounts; add more if necessary.  Try drinking through a straw  Rinse mouth with water or a mouthwash between glasses, to remove after-taste.  Try sipping on a cold beverage /ice/ popsicles between glasses of prep.  Place a piece of sugar-free hard candy in mouth between glasses.  If you become nauseated, try consuming smaller amounts, or stretch out the time between glasses. Stop for 30-60 minutes, then slowly start back drinking.     OTHER INSTRUCTIONS  You will need a  responsible adult at least 51 years of age to accompany you and drive you home. This person must remain in the waiting room during your procedure. The hospital will cancel your procedure if you do not have a responsible adult with you.   1. Wear loose fitting clothing that is easily removed. 2. Leave jewelry and other valuables at home.  3. Remove all body piercing jewelry and leave at home. 4. Total time from sign-in until discharge is approximately 2-3 hours. 5. You should go home directly after your procedure and rest. You can resume normal activities the day after your procedure. 6. The day of your procedure you should not:  Drive  Make legal decisions  Operate machinery  Drink alcohol  Return to work   You may call the office (Dept: [phone]) before 5:00pm, or page the doctor on call [phone]) after 5:00pm, for further instructions, if necessary.   Insurance Information YOU WILL NEED TO CHECK WITH YOUR INSURANCE COMPANY FOR THE BENEFITS OF COVERAGE YOU HAVE FOR THIS PROCEDURE.  UNFORTUNATELY, NOT ALL INSURANCE COMPANIES HAVE BENEFITS TO COVER ALL OR PART OF THESE TYPES OF PROCEDURES.  IT IS YOUR RESPONSIBILITY TO CHECK YOUR BENEFITS, HOWEVER, WE WILL BE GLAD TO ASSIST YOU WITH ANY CODES YOUR INSURANCE COMPANY MAY NEED.    PLEASE NOTE THAT MOST INSURANCE COMPANIES WILL NOT COVER A SCREENING COLONOSCOPY FOR PEOPLE UNDER THE AGE OF 50  IF YOU HAVE BCBS INSURANCE, YOU MAY HAVE BENEFITS FOR A SCREENING COLONOSCOPY BUT IF POLYPS ARE FOUND THE DIAGNOSIS WILL CHANGE AND THEN YOU MAY HAVE A DEDUCTIBLE THAT WILL NEED TO BE MET. SO PLEASE MAKE SURE YOU CHECK YOUR BENEFITS FOR A SCREENING COLONOSCOPY AS WELL AS A DIAGNOSTIC COLONOSCOPY.

## 2018-12-08 NOTE — Progress Notes (Signed)
Ok to schedule.

## 2018-12-08 NOTE — Progress Notes (Addendum)
Gastroenterology Pre-Procedure Review  Request Date: 12/08/2018 Requesting Physician: Evelina Dun, NP @ [facility], no previous TCS  PATIENT REVIEW QUESTIONS: The patient responded to the following health history questions as indicated:    1. Diabetes Melitis: no 2. Joint replacements in the past 12 months: no 3. Major health problems in the past 3 months: no 4. Has an artificial valve or MVP: no 5. Has a defibrillator: no 6. Has been advised in past to take antibiotics in advance of a procedure like teeth cleaning: no 7. Family history of colon cancer: yes, paternal uncle: age 61's  8. Alcohol Use: yes, 6 pack on the weekend 9. Illicit drug Use: no 10. History of sleep apnea: no  11. History of coronary artery or other vascular stents placed within the last 12 months: no 12. History of any prior anesthesia complications: no 13. There is no height or weight on file to calculate BMI.ht: 5'2 wt: 210 lbs    MEDICATIONS & ALLERGIES:    Patient reports the following regarding taking any blood thinners:   Plavix? no Aspirin? no Coumadin? no Brilinta? no Xarelto? no Eliquis? no Pradaxa? no Savaysa? no Effient? no  Patient confirms/reports the following medications:  Current Outpatient Medications  Medication Sig Dispense Refill  . ibuprofen (ADVIL) 200 MG tablet Take 200 mg by mouth as needed.    . Multiple Vitamin (MULTI VITAMIN PO) Take by mouth daily.      No current facility-administered medications for this visit.     Patient confirms/reports the following allergies:  Allergies  Allergen Reactions  . Latex     rash    No orders of the defined types were placed in this encounter.   AUTHORIZATION INFORMATION Primary Insurance: [place],  [place] [id number] ,  Group #: [id number] Pre-Cert / Auth required: Yes, approved online 02/02/2019 to [id number] Pre-Cert / Auth #: [id number]  SCHEDULE INFORMATION: Procedure has been scheduled as follows:  Date: 02/02/2019, Time:  1:30 Location: [facility] with Dr. Oneida Alar  This Gastroenterology Pre-Precedure Review Form is being routed to the following provider(s): Neil Crouch, PA-C

## 2018-12-09 NOTE — Addendum Note (Signed)
Addended by: Metro Kung on: 12/09/2018 09:42 AM   Modules accepted: Orders, SmartSet

## 2018-12-26 ENCOUNTER — Telehealth: Payer: Self-pay | Admitting: Gastroenterology

## 2018-12-26 NOTE — Telephone Encounter (Signed)
Pt said she was returning a call. Please call [phone]

## 2018-12-26 NOTE — Telephone Encounter (Addendum)
Spoke with pt and she is aware that Covid screening needs to be rescheduled to 01/31/2019.  Pt rescheduled to 01/31/2019 at 8:35.  Pt is aware that I will mail her Covid screening information with new appt date and time.  Scanned into Epic.

## 2019-01-08 ENCOUNTER — Other Ambulatory Visit: Payer: Worker's Compensation | Admitting: Obstetrics & Gynecology

## 2019-01-21 ENCOUNTER — Ambulatory Visit: Payer: 59 | Admitting: Family

## 2019-01-29 ENCOUNTER — Other Ambulatory Visit (HOSPITAL_COMMUNITY): Payer: Worker's Compensation

## 2019-01-31 ENCOUNTER — Other Ambulatory Visit: Payer: Self-pay

## 2019-01-31 ENCOUNTER — Other Ambulatory Visit (HOSPITAL_COMMUNITY)
Admission: RE | Admit: 2019-01-31 | Discharge: 2019-01-31 | Disposition: A | Discharge status: Home / Self Care | Payer: 59 | Source: Ambulatory Visit | Attending: Gastroenterology | Admitting: Gastroenterology

## 2019-01-31 DIAGNOSIS — Z20828 Contact with and (suspected) exposure to other viral communicable diseases: Secondary | ICD-10-CM | POA: Diagnosis not present

## 2019-01-31 DIAGNOSIS — Z01812 Encounter for preprocedural laboratory examination: Secondary | ICD-10-CM | POA: Diagnosis present

## 2019-01-31 LAB — SARS CORONAVIRUS 2 (TAT 6-24 HRS): SARS Coronavirus 2: NEGATIVE

## 2019-02-02 ENCOUNTER — Encounter (HOSPITAL_COMMUNITY)
Admission: RE | Disposition: A | Discharge status: Home / Self Care | Payer: Self-pay | Source: Home / Self Care | Attending: Gastroenterology

## 2019-02-02 ENCOUNTER — Encounter (HOSPITAL_COMMUNITY): Payer: Self-pay | Admitting: Gastroenterology

## 2019-02-02 ENCOUNTER — Other Ambulatory Visit: Payer: Self-pay

## 2019-02-02 ENCOUNTER — Ambulatory Visit (HOSPITAL_COMMUNITY)
Admission: RE | Admit: 2019-02-02 | Discharge: 2019-02-02 | Disposition: A | Discharge status: Home / Self Care | Payer: 59 | Attending: Gastroenterology | Admitting: Gastroenterology

## 2019-02-02 DIAGNOSIS — Z87891 Personal history of nicotine dependence: Secondary | ICD-10-CM | POA: Diagnosis not present

## 2019-02-02 DIAGNOSIS — Q438 Other specified congenital malformations of intestine: Secondary | ICD-10-CM | POA: Diagnosis not present

## 2019-02-02 DIAGNOSIS — K635 Polyp of colon: Secondary | ICD-10-CM | POA: Diagnosis not present

## 2019-02-02 DIAGNOSIS — K644 Residual hemorrhoidal skin tags: Secondary | ICD-10-CM | POA: Insufficient documentation

## 2019-02-02 DIAGNOSIS — D124 Benign neoplasm of descending colon: Secondary | ICD-10-CM | POA: Diagnosis not present

## 2019-02-02 DIAGNOSIS — Z1211 Encounter for screening for malignant neoplasm of colon: Secondary | ICD-10-CM | POA: Diagnosis not present

## 2019-02-02 HISTORY — PX: COLONOSCOPY: SHX174

## 2019-02-02 HISTORY — PX: COLONOSCOPY: SHX5424

## 2019-02-02 HISTORY — PX: POLYPECTOMY: SHX5525

## 2019-02-02 SURGERY — COLONOSCOPY
Anesthesia: Moderate Sedation

## 2019-02-02 MED ORDER — SODIUM CHLORIDE 0.9 % IV SOLN: INTRAVENOUS | Status: DC

## 2019-02-02 MED ORDER — MIDAZOLAM HCL 5 MG/5ML IJ SOLN
INTRAMUSCULAR | Status: DC | PRN
  Administered 2019-02-02 (×3): 2 mg via INTRAVENOUS

## 2019-02-02 MED ORDER — MIDAZOLAM HCL 5 MG/5ML IJ SOLN
INTRAMUSCULAR | Status: AC
  Filled 2019-02-02: qty 10

## 2019-02-02 MED ORDER — MEPERIDINE HCL 100 MG/ML IJ SOLN
INTRAMUSCULAR | Status: AC
  Filled 2019-02-02: qty 2

## 2019-02-02 MED ORDER — STERILE WATER FOR IRRIGATION IR SOLN
Status: DC | PRN
  Administered 2019-02-02: 1.5 mL

## 2019-02-02 MED ORDER — MEPERIDINE HCL 100 MG/ML IJ SOLN
INTRAMUSCULAR | Status: DC | PRN
  Administered 2019-02-02: 25 mg via INTRAVENOUS
  Administered 2019-02-02: 50 mg via INTRAVENOUS
  Administered 2019-02-02: 25 mg via INTRAVENOUS

## 2019-02-02 NOTE — H&P (Signed)
Primary Care Physician:  Sharion Balloon, FNP Primary Gastroenterologist:  Dr. Oneida Alar  Pre-Procedure History & Physical: HPI:  Kim Reed is a 53 y.o. female here for [place].  Past Medical History:  Diagnosis Date  . Endometriosis   . Ovarian cyst     Past Surgical History:  Procedure Laterality Date  . EYE SURGERY    . TUBAL LIGATION      Prior to Admission medications   Medication Sig Start Date End Date Taking? Authorizing Provider  ibuprofen (ADVIL) 200 MG tablet Take 400 mg by mouth as needed for headache or moderate pain.    Yes [provider]  Multiple Vitamin (MULTI VITAMIN PO) Take by mouth daily.    Yes [provider]    Allergies as of 12/09/2018 - Review Complete 12/08/2018  Allergen Reaction Noted  . Latex  03/04/2014    Family History  Problem Relation Age of Onset  . Thyroid disease Mother   . Cancer Father        prostate  . Thyroid disease Father   . Asthma Sister   . Other Sister        had heavy periods  . GER disease Brother   . Cancer Paternal Grandmother   . Alzheimer's disease Maternal Grandmother   . Heart attack Maternal Grandfather   . Breast cancer Other   . Colon cancer Other     Social History   Socioeconomic History  . Marital status: Divorced    Spouse name: Not on file  . Number of children: Not on file  . Years of education: Not on file  . Highest education level: Not on file  Occupational History    Employer: [employer]  Tobacco Use  . Smoking status: Former Smoker    Types: Cigarettes    Quit date: 02/18/2008    Years since quitting: 10.9  . Smokeless tobacco: Never Used  Substance and Sexual Activity  . Alcohol use: Yes    Alcohol/week: 0.0 standard drinks    Comment: socially  . Drug use: No  . Sexual activity: Yes    Birth control/protection: Surgical    Comment: tubal  Other Topics Concern  . Not on file  Social History Narrative  . Not on  file   Social Determinants of Health   Financial Resource Strain:   . Difficulty of Paying Living Expenses: Not on file  Food Insecurity:   . Worried About Charity fundraiser in the Last Year: Not on file  . Ran Out of Food in the Last Year: Not on file  Transportation Needs:   . Lack of Transportation (Medical): Not on file  . Lack of Transportation (Non-Medical): Not on file  Physical Activity:   . Days of Exercise per Week: Not on file  . Minutes of Exercise per Session: Not on file  Stress:   . Feeling of Stress : Not on file  Social Connections:   . Frequency of Communication with Friends and Family: Not on file  . Frequency of Social Gatherings with Friends and Family: Not on file  . Attends Religious Services: Not on file  . Active Member of Clubs or Organizations: Not on file  . Attends Archivist Meetings: Not on file  . Marital Status: Not on file  Intimate Partner Violence:   . Fear of Current or Ex-Partner: Not on file  . Emotionally Abused: Not on file  . Physically Abused: Not on  file  . Sexually Abused: Not on file    Review of Systems: See HPI, otherwise negative ROS   Physical Exam: BP 111/73   Pulse 80   Temp 97.7 F (36.5 C) (Oral)   Resp 14   SpO2 100%  General:   Alert,  pleasant and cooperative in NAD Head:  Normocephalic and atraumatic. Neck:  Supple; Lungs:  Clear throughout to auscultation.    Heart:  Regular rate and rhythm. Abdomen:  Soft, nontender and nondistended. Normal bowel sounds, without guarding, and without rebound.   Neurologic:  Alert and  oriented x4;  grossly normal neurologically.  Impression/Plan:    SCREENING  Plan:  1. TCS TODAY DISCUSSED PROCEDURE, BENEFITS, & RISKS: < 1% chance of medication reaction, bleeding, perforation, ASPIRATION, or rupture of spleen/liver requiring surgery to fix it and missed polyps < 1 cm 10-20% of the time.

## 2019-02-02 NOTE — Op Note (Signed)
[facility] Patient Name: Kim Reed Procedure Date: 02/02/2019 12:14 PM MRN: [id number] Date of Birth: Jul 01, 1965 Attending MD: Barney Drain MD, MD CSN: [id number] Age: 53 Admit Type: Outpatient Procedure:                Colonoscopy WITH COLD SNARE POLYPECTOMY Indications:              Screening for colorectal malignant neoplasm Providers:                Barney Drain MD, MD, Charlsie Quest. Theda Sers RN, RN,                            Nelma Rothman, Technician Referring MD:             Theador Hawthorne. Hawks Medicines:                Meperidine 100 mg IV, Midazolam 6 mg IV Complications:            No immediate complications. Estimated Blood Loss:     Estimated blood loss was minimal. Procedure:                Pre-Anesthesia Assessment:                           - Prior to the procedure, a History and Physical                            was performed, and patient medications and                            allergies were reviewed. The patient's tolerance of                            previous anesthesia was also reviewed. The risks                            and benefits of the procedure and the sedation                            options and risks were discussed with the patient.                            All questions were answered, and informed consent                            was obtained. Prior Anticoagulants: The patient has                            taken no previous anticoagulant or antiplatelet                            agents except for NSAID medication. ASA Grade                            Assessment: II - A patient with mild systemic  disease. After reviewing the risks and benefits,                            the patient was deemed in satisfactory condition to                            undergo the procedure. After obtaining informed                            consent, the colonoscope was passed under direct                            vision.  Throughout the procedure, the patient's                            blood pressure, pulse, and oxygen saturations were                            monitored continuously. The [id number]                            scope was introduced through the anus and advanced                            to the the cecum, identified by appendiceal orifice                            and ileocecal valve. The colonoscopy was somewhat                            difficult due to a tortuous colon and the patient's                            agitation. Successful completion of the procedure                            was aided by increasing the dose of sedation                            medication, straightening and shortening the scope                            to obtain bowel loop reduction and COLOWRAP. The                            patient tolerated the procedure fairly well. The                            quality of the bowel preparation was good. The                            ileocecal valve, appendiceal orifice, and rectum  were photographed. Scope In: 1:14:55 PM Scope Out: 1:32:09 PM Scope Withdrawal Time: 0 hours 12 minutes 39 seconds  Total Procedure Duration: 0 hours 17 minutes 14 seconds  Findings:      A 5 mm polyp was found in the descending colon. The polyp was sessile.       The polyp was removed with a cold snare. Resection and retrieval were       complete.      External hemorrhoids were found. The hemorrhoids were moderate.      The recto-sigmoid colon, sigmoid colon and descending colon were       moderately tortuous. Impression:               - One 5 mm polyp in the descending colon, removed                            with a cold snare. Resected and retrieved.                           - External hemorrhoids.                           - Tortuous colon. Moderate Sedation:      Moderate (conscious) sedation was administered by the endoscopy nurse        and supervised by the endoscopist. The following parameters were       monitored: oxygen saturation, heart rate, blood pressure, and response       to care. Total physician intraservice time was 33 minutes. Recommendation:           - Patient has a contact number available for                            emergencies. The signs and symptoms of potential                            delayed complications were discussed with the                            patient. Return to normal activities tomorrow.                            Written discharge instructions were provided to the                            patient.                           - High fiber diet. LOSE WEIGHT TO BMI < 30                            FOLLOWING RECOMMENDATIONS OF DR. MARK HYMAN.                           - Continue present medications.                           - Await pathology results.                           -  Repeat colonoscopy in 5-10 years for surveillance. Procedure Code(s):        --- Professional ---                           [id number], Colonoscopy, flexible; with removal of                            tumor(s), polyp(s), or other lesion(s) by snare                            technique                           99153, Moderate sedation; each additional 15                            minutes intraservice time                           G0500, Moderate sedation services provided by the                            same physician or other qualified health care                            professional performing a gastrointestinal                            endoscopic service that sedation supports,                            requiring the presence of an independent trained                            observer to assist in the monitoring of the                            patient's level of consciousness and physiological                            status; initial 15 minutes of intra-service time;                             patient age 28 years or older (additional time may                            be reported with [id number], as appropriate) Diagnosis Code(s):        --- Professional ---                           Z12.11, Encounter for screening for malignant                            neoplasm of colon  K63.5, Polyp of colon                           K64.4, Residual hemorrhoidal skin tags                           Q43.8, Other specified congenital malformations of                            intestine CPT copyright 2019 American Medical Association. All rights reserved. The codes documented in this report are preliminary and upon coder review may  be revised to meet current compliance requirements. Barney Drain, MD Barney Drain MD, MD 02/02/2019 1:58:47 PM This report has been signed electronically. Number of Addenda: 0

## 2019-02-02 NOTE — Discharge Instructions (Signed)
You had 1 polyp removed. You have EXTERNAL internal hemorrhoids.   EAT TO LIVE AND THINK OF FOOD AS MEDICINE. 75% OF YOUR PLATE SHOULD BE FRUITS/VEGGIES.  To have more energy, and to lose weight:      1. CONTINUE YOUR WEIGHT LOSS EFFORTS. I RECOMMEND YOU READ AND FOLLOW RECOMMENDATIONS BY DR. MARK HYMAN, "10-DAY DETOX DIET".    2. If you must eat bread, EAT EZEKIEL BREAD. IT IS IN THE FROZEN SECTION OF THE GROCERY STORE.    3. DRINK WATER WITH FRUIT OR CUCUMBER ADDED. YOUR URINE SHOULD BE LIGHT YELLOW. AVOID SODA, GATORADE, ENERGY DRINKS, OR DIET SODA.     4. AVOID HIGH FRUCTOSE CORN SYRUP AND CAFFEINE.     5. DO NOT chew SUGAR FREE GUM OR USE ARTIFICIAL SWEETENERS. IF NEEDED USE STEVIA AS A SWEETENER.    6. DO NOT EAT ENRICHED WHEAT FLOUR, PASTA, RICE, OR CEREAL.    7. ONLY EAT WILD CAUGHT SEAFOOD, GRASS FED BEEF OR CHICKEN, PORK FROM PASTURE RAISE PIGS, OR EGGS FROM PASTURE RAISED CHICKENS.    8. PRACTICE CHAIR YOGA FOR 15-30 MINS 3 OR 4 TIMES A WEEK AND PROGRESS TO HATHA YOGA OVER NEXT 6 MOS.    9. START TAKING A MULTIVITAMIN, VITAMIN B12, AND VITAMIN D3 2000 IU DAILY.   ADDITIONAL SUPPLEMENTS TO DECREASE CRAVING AND SUPPRESS YOUR APPETITE:    1. CINNAMON 500 MG EVERY AM PRIOR TO FIRST MEAL.   **STABILIZES BLOOD GLUCOSE/REDUCES CRAVINGS**    2. CHROMIUM 400-500 MG WITH MEALS TWICE DAILY.    **FAT BURNER**    3. GREEN TEA EXTRACT ONE DAILY.   **FAT BURNER/SUPPRESSES YOUR APPETITE**    4. ALPHA LIPOIC ACID TWICE DAILY.   **NATURAL ANTI-INFLAMMATORY SUPPLEMENT THAT IS AN ALTERNATIVE TO IBUPROFEN OR NAPROXEN**   YOUR BIOPSY RESULTS WILL BE BACK IN 5 BUSINESS DAYS.  Next colonoscopy in 5-10 years.    Colonoscopy Care After Read the instructions outlined below and refer to this sheet in the next week. These discharge instructions provide you with general information on caring for yourself after you leave the hospital. While your treatment has been planned according to the  most current medical practices available, unavoidable complications occasionally occur. If you have any problems or questions after discharge, call DR. Sahira Cataldi, [phone].  ACTIVITY  You may resume your regular activity, but move at a slower pace for the next 24 hours.   Take frequent rest periods for the next 24 hours.   Walking will help get rid of the air and reduce the bloated feeling in your belly (abdomen).   No driving for 24 hours (because of the medicine (anesthesia) used during the test).   You may shower.   Do not sign any important legal documents or operate any machinery for 24 hours (because of the anesthesia used during the test).    NUTRITION  Drink plenty of fluids.   You may resume your normal diet as instructed by your doctor.   Begin with a light meal and progress to your normal diet. Heavy or fried foods are harder to digest and may make you feel sick to your stomach (nauseated).   Avoid alcoholic beverages for 24 hours or as instructed.    MEDICATIONS  You may resume your normal medications.   WHAT YOU CAN EXPECT TODAY  Some feelings of bloating in the abdomen.   Passage of more gas than usual.   Spotting of blood in your stool or on the toilet paper  .  IF YOU HAD POLYPS REMOVED DURING THE COLONOSCOPY:  Eat a soft diet IF YOU HAVE NAUSEA, BLOATING, ABDOMINAL PAIN, OR VOMITING.    FINDING OUT THE RESULTS OF YOUR TEST Not all test results are available during your visit. DR. Oneida Alar WILL CALL YOU WITHIN 14 DAYS OF YOUR PROCEDUE WITH YOUR RESULTS. Do not assume everything is normal if you have not heard from DR. Lamoyne Hessel, CALL HER OFFICE AT [phone].  SEEK IMMEDIATE MEDICAL ATTENTION AND CALL THE OFFICE: [phone] IF:  You have more than a spotting of blood in your stool.   Your belly is swollen (abdominal distention).   You are nauseated or vomiting.   You have a temperature over 101F.   You have abdominal pain or discomfort that  is severe or gets worse throughout the day.   High-Fiber Diet A high-fiber diet changes your normal diet to include more whole grains, legumes, fruits, and vegetables. Changes in the diet involve replacing refined carbohydrates with unrefined foods. The calorie level of the diet is essentially unchanged. The Dietary Reference Intake (recommended amount) for adult males is 38 grams per day. For adult females, it is 25 grams per day. Pregnant and lactating women should consume 28 grams of fiber per day. Fiber is the intact part of a plant that is not broken down during digestion. Functional fiber is fiber that has been isolated from the plant to provide a beneficial effect in the body.  PURPOSE  Increase stool bulk.   Ease and regulate bowel movements.   Lower cholesterol.   REDUCE RISK OF COLON CANCER  INDICATIONS THAT YOU NEED MORE FIBER  Constipation and hemorrhoids.   Uncomplicated diverticulosis (intestine condition) and irritable bowel syndrome.   Weight management.   As a protective measure against hardening of the arteries (atherosclerosis), diabetes, and cancer.   GUIDELINES FOR INCREASING FIBER IN THE DIET  Start adding fiber to the diet slowly. A gradual increase of about 5 more grams (2 servings of most fruits or vegetables) per day is best. Too rapid an increase in fiber may result in constipation, flatulence, and bloating.   Drink enough water and fluids to keep your urine clear or pale yellow. Water, juice, or caffeine-free drinks are recommended. Not drinking enough fluid may cause constipation.   Eat a variety of high-fiber foods rather than one type of fiber.   Try to increase your intake of fiber through using high-fiber foods rather than fiber pills or supplements that contain small amounts of fiber.   The goal is to change the types of food eaten. Do not supplement your present diet with high-fiber foods, but replace foods in your present diet.     Polyps,  Colon  A polyp is extra tissue that grows inside your body. Colon polyps grow in the large intestine. The large intestine, also called the colon, is part of your digestive system. It is a long, hollow tube at the end of your digestive tract where your body makes and stores stool. Most polyps are not dangerous. They are benign. This means they are not cancerous. But over time, some types of polyps can turn into cancer. Polyps that are smaller than a pea are usually not harmful. But larger polyps could someday become or may already be cancerous. To be safe, doctors remove all polyps and test them.   WHO GETS POLYPS? Anyone can get polyps, but certain people are more likely than others. You may have a greater chance of getting polyps if:  You  are over 46.   You have had polyps before.   Someone in your family has had polyps.   Someone in your family has had cancer of the large intestine.   Find out if someone in your family has had polyps. You may also be more likely to get polyps if you:   Eat a lot of fatty foods   Smoke   Drink alcohol   Do not exercise  Eat too much   PREVENTION There is not one sure way to prevent polyps. You might be able to lower your risk of getting them if you:  Eat more fruits and vegetables and less fatty food.   Do not smoke.   Avoid alcohol.   Exercise every day.   Lose weight if you are overweight.   Eating more calcium and folate can also lower your risk of getting polyps. Some foods that are rich in calcium are milk, cheese, and broccoli. Some foods that are rich in folate are chickpeas, kidney beans, and spinach.

## 2019-02-03 ENCOUNTER — Ambulatory Visit (INDEPENDENT_AMBULATORY_CARE_PROVIDER_SITE_OTHER): Payer: 59 | Admitting: Obstetrics & Gynecology

## 2019-02-03 ENCOUNTER — Other Ambulatory Visit: Payer: Self-pay

## 2019-02-03 ENCOUNTER — Encounter: Payer: Self-pay | Admitting: Obstetrics & Gynecology

## 2019-02-03 VITALS — BP 115/67 | HR 70 | Ht 63.0 in | Wt 216.0 lb

## 2019-02-03 DIAGNOSIS — Z01419 Encounter for gynecological examination (general) (routine) without abnormal findings: Secondary | ICD-10-CM

## 2019-02-03 LAB — SURGICAL PATHOLOGY

## 2019-02-03 NOTE — Progress Notes (Signed)
Subjective:     Kim Reed is a 53 y.o. female here for a routine exam.  Patient's last menstrual period was 01/13/2019 (exact date). [id number] Birth Control Method:  BTL Menstrual Calendar(currently): peri menopausal irregularity  Current complaints: none.   Current acute medical issues:  none   Recent Gynecologic History Patient's last menstrual period was 01/13/2019 (exact date). Last Pap: 2018,  normal Last mammogram: 2020,  normal  Past Medical History:  Diagnosis Date  . Endometriosis   . Ovarian cyst     Past Surgical History:  Procedure Laterality Date  . COLONOSCOPY  02/02/2019  . EYE SURGERY    . TUBAL LIGATION      OB History    Gravida  2   Para  2   Term  2   Preterm      AB      Living  2     SAB      TAB      Ectopic      Multiple      Live Births  2           Social History   Socioeconomic History  . Marital status: Divorced    Spouse name: Not on file  . Number of children: Not on file  . Years of education: Not on file  . Highest education level: Not on file  Occupational History    Employer: [employer]  Tobacco Use  . Smoking status: Former Smoker    Types: Cigarettes    Quit date: 02/18/2008    Years since quitting: 10.9  . Smokeless tobacco: Never Used  Substance and Sexual Activity  . Alcohol use: Yes    Alcohol/week: 0.0 standard drinks    Comment: socially  . Drug use: No  . Sexual activity: Yes    Birth control/protection: Surgical    Comment: tubal  Other Topics Concern  . Not on file  Social History Narrative  . Not on file   Social Determinants of Health   Financial Resource Strain:   . Difficulty of Paying Living Expenses: Not on file  Food Insecurity:   . Worried About Charity fundraiser in the Last Year: Not on file  . Ran Out of Food in the Last Year: Not on file  Transportation Needs:   . Lack of Transportation (Medical): Not on file  . Lack of Transportation  (Non-Medical): Not on file  Physical Activity:   . Days of Exercise per Week: Not on file  . Minutes of Exercise per Session: Not on file  Stress:   . Feeling of Stress : Not on file  Social Connections:   . Frequency of Communication with Friends and Family: Not on file  . Frequency of Social Gatherings with Friends and Family: Not on file  . Attends Religious Services: Not on file  . Active Member of Clubs or Organizations: Not on file  . Attends Archivist Meetings: Not on file  . Marital Status: Not on file    Family History  Problem Relation Age of Onset  . Thyroid disease Mother   . Colon polyps Mother        AGE > 69  . Cancer Father        prostate  . Thyroid disease Father   . Colon polyps Father        AGE > 3  . Asthma Sister   . Other Sister  had heavy periods  . GER disease Brother   . Cancer Paternal Grandmother   . Alzheimer's disease Maternal Grandmother   . Heart attack Maternal Grandfather   . Breast cancer Other   . Colon cancer Other   . Colon cancer Paternal Uncle        AGE < 60     Current Outpatient Medications:  .  ibuprofen (ADVIL) 200 MG tablet, Take 400 mg by mouth as needed for headache or moderate pain. , Disp: , Rfl:  .  Multiple Vitamin (MULTI VITAMIN PO), Take by mouth daily. , Disp: , Rfl:   Review of Systems  Review of Systems  Constitutional: Negative for fever, chills, weight loss, malaise/fatigue and diaphoresis.  HENT: Negative for hearing loss, ear pain, nosebleeds, congestion, sore throat, neck pain, tinnitus and ear discharge.   Eyes: Negative for blurred vision, double vision, photophobia, pain, discharge and redness.  Respiratory: Negative for cough, hemoptysis, sputum production, shortness of breath, wheezing and stridor.   Cardiovascular: Negative for chest pain, palpitations, orthopnea, claudication, leg swelling and PND.  Gastrointestinal: negative for abdominal pain. Negative for heartburn, nausea,  vomiting, diarrhea, constipation, blood in stool and melena.  Genitourinary: Negative for dysuria, urgency, frequency, hematuria and flank pain.  Musculoskeletal: Negative for myalgias, back pain, joint pain and falls.  Skin: Negative for itching and rash.  Neurological: Negative for dizziness, tingling, tremors, sensory change, speech change, focal weakness, seizures, loss of consciousness, weakness and headaches.  Endo/Heme/Allergies: Negative for environmental allergies and polydipsia. Does not bruise/bleed easily.  Psychiatric/Behavioral: Negative for depression, suicidal ideas, hallucinations, memory loss and substance abuse. The patient is not nervous/anxious and does not have insomnia.        Objective:  Blood pressure 115/67, pulse 70, height 5\' 3"  (1.6 m), weight 216 lb (98 kg), last menstrual period 01/13/2019.   Physical Exam  Vitals reviewed. Constitutional: She is oriented to person, place, and time. She appears well-developed and well-nourished.  HENT:  Head: Normocephalic and atraumatic.        Right Ear: External ear normal.  Left Ear: External ear normal.  Nose: Nose normal.  Mouth/Throat: Oropharynx is clear and moist.  Eyes: Conjunctivae and EOM are normal. Pupils are equal, round, and reactive to light. Right eye exhibits no discharge. Left eye exhibits no discharge. No scleral icterus.  Neck: Normal range of motion. Neck supple. No tracheal deviation present. No thyromegaly present.  Cardiovascular: Normal rate, regular rhythm, normal heart sounds and intact distal pulses.  Exam reveals no gallop and no friction rub.   No murmur heard. Respiratory: Effort normal and breath sounds normal. No respiratory distress. She has no wheezes. She has no rales. She exhibits no tenderness.  GI: Soft. Bowel sounds are normal. She exhibits no distension and no mass. There is no tenderness. There is no rebound and no guarding.  Genitourinary:  Breasts no masses skin changes or nipple  changes bilaterally      Vulva is normal without lesions Vagina is pink moist without discharge Cervix normal in appearance and pap is not done Uterus is normal size shape and contour Adnexa is negative with normal sized ovaries  Colonoscopy yesterday Musculoskeletal: Normal range of motion. She exhibits no edema and no tenderness.  Neurological: She is alert and oriented to person, place, and time. She has normal reflexes. She displays normal reflexes. No cranial nerve deficit. She exhibits normal muscle tone. Coordination normal.  Skin: Skin is warm and dry. No rash noted. No erythema. No  pallor.  Psychiatric: She has a normal mood and affect. Her behavior is normal. Judgment and thought content normal.       Medications Ordered at today's visit: No orders of the defined types were placed in this encounter.   Other orders placed at today's visit: No orders of the defined types were placed in this encounter.     Assessment:    Healthy female exam.    Plan:    Mammogram ordered. Follow up in: 1 year.     No follow-ups on file.

## 2019-02-18 ENCOUNTER — Telehealth: Payer: Self-pay | Admitting: *Deleted

## 2019-02-18 NOTE — Telephone Encounter (Signed)
Patient left message on nurse line requesting results of pap.   LMOVM that pap was not done at her last visit.  Advised to call back with further questions.

## 2019-03-23 ENCOUNTER — Encounter: Payer: Self-pay | Admitting: Family

## 2019-03-23 ENCOUNTER — Ambulatory Visit: Payer: Worker's Compensation | Attending: Internal Medicine

## 2019-03-23 ENCOUNTER — Ambulatory Visit (INDEPENDENT_AMBULATORY_CARE_PROVIDER_SITE_OTHER): Payer: 59 | Admitting: Family

## 2019-03-23 ENCOUNTER — Other Ambulatory Visit: Payer: Self-pay

## 2019-03-23 VITALS — Ht 63.0 in | Wt 210.0 lb

## 2019-03-23 DIAGNOSIS — R062 Wheezing: Secondary | ICD-10-CM

## 2019-03-23 DIAGNOSIS — Z20822 Contact with and (suspected) exposure to covid-19: Secondary | ICD-10-CM | POA: Diagnosis not present

## 2019-03-23 DIAGNOSIS — R05 Cough: Secondary | ICD-10-CM | POA: Diagnosis not present

## 2019-03-23 MED ORDER — DEXAMETHASONE 6 MG PO TABS: 6.0000 mg | ORAL_TABLET | Freq: Every day | ORAL | 0 refills | Status: DC

## 2019-03-23 MED ORDER — PROMETHAZINE-DM 6.25-15 MG/5ML PO SYRP: 5.0000 mL | ORAL_SOLUTION | Freq: Three times a day (TID) | ORAL | 0 refills | Status: DC | PRN

## 2019-03-23 MED ORDER — ALBUTEROL SULFATE HFA 108 (90 BASE) MCG/ACT IN AERS: 2.0000 | INHALATION_SPRAY | Freq: Four times a day (QID) | RESPIRATORY_TRACT | 1 refills | Status: DC | PRN

## 2019-03-23 NOTE — Progress Notes (Signed)
Virtual Visit via telephone Note Due to COVID-19 pandemic this visit was conducted virtually. This visit type was conducted due to national recommendations for restrictions regarding the COVID-19 Pandemic (e.g. social distancing, sheltering in place) in an effort to limit this patient's exposure and mitigate transmission in our community. All issues noted in this document were discussed and addressed.  A physical exam was not performed with this format.  I connected with Kim Reed on 03/23/19 at 10:20 AM  by telephone and verified that I am speaking with the correct person using two identifiers. Kim Reed is currently located in her car and no one is currently with  her during visit. The provider, Evelina Dun, FNP is located in their office at time of visit.  I discussed the limitations, risks, security and privacy concerns of performing an evaluation and management service by telephone and the availability of in person appointments. I also discussed with the patient that there may be a patient responsible charge related to this service. The patient expressed understanding and agreed to proceed.   History and Present Illness:  Pt calls the office with COVID like symptoms. She states her husband tested positive for COVID three weeks ago. She was tested this morning.  Cough This is a new problem. The current episode started yesterday. The problem has been rapidly worsening. The problem occurs every few minutes. The cough is non-productive. Associated symptoms include chest pain (burning), chills, ear congestion, ear pain, a fever, headaches, nasal congestion, shortness of breath and wheezing. Pertinent negatives include no myalgias or sore throat. The symptoms are aggravated by lying down. She has tried rest and OTC cough suppressant for the symptoms. The treatment provided mild relief. There is no history of asthma or COPD.    Ht 5\' 3"  (1.6 m)   Wt 210 lb (95.3 kg)   BMI  37.20 kg/m    Review of Systems  Constitutional: Positive for chills and fever.  HENT: Positive for ear pain. Negative for sore throat.   Respiratory: Positive for cough, shortness of breath and wheezing.   Cardiovascular: Positive for chest pain (burning).  Musculoskeletal: Negative for myalgias.  Neurological: Positive for headaches.  All other systems reviewed and are negative.    Observations/Objective: No SOB or distress noted, nasal voice  Assessment and Plan: 1. Encounter by telehealth for suspected COVID-19 Pt was tested today Will start dexamethasone today, albuterol inhaler as needed Rest Self isolate until results return Force fluids Tylenol as needed Call if symptoms worsen  - MyChart COVID-19 home monitoring program; Future - dexamethasone (DECADRON) 6 MG tablet; Take 1 tablet (6 mg total) by mouth daily.  Dispense: 6 tablet; Refill: 0 - albuterol (VENTOLIN HFA) 108 (90 Base) MCG/ACT inhaler; Inhale 2 puffs into the lungs every 6 (six) hours as needed for wheezing or shortness of breath.  Dispense: 18 g; Refill: 1 - promethazine-dextromethorphan (PROMETHAZINE-DM) 6.25-15 MG/5ML syrup; Take 5 mLs by mouth 3 (three) times daily as needed for cough.  Dispense: 240 mL; Refill: 0     I discussed the assessment and treatment plan with the patient. The patient was provided an opportunity to ask questions and all were answered. The patient agreed with the plan and demonstrated an understanding of the instructions.   The patient was advised to call back or seek an in-person evaluation if the symptoms worsen or if the condition fails to improve as anticipated.  The above assessment and management plan was discussed with the patient. The patient  verbalized understanding of and has agreed to the management plan. Patient is aware to call the clinic if symptoms persist or worsen. Patient is aware when to return to the clinic for a follow-up visit. Patient educated on when it is  appropriate to go to the emergency department.   Time call ended:  10:34 Am  I provided 14 minutes of non-face-to-face time during this encounter.    Evelina Dun, FNP

## 2019-03-24 ENCOUNTER — Telehealth: Payer: Self-pay | Admitting: Family

## 2019-03-24 ENCOUNTER — Encounter (INDEPENDENT_AMBULATORY_CARE_PROVIDER_SITE_OTHER): Payer: Self-pay

## 2019-03-24 ENCOUNTER — Ambulatory Visit: Payer: Self-pay

## 2019-03-24 ENCOUNTER — Telehealth: Payer: Self-pay

## 2019-03-24 LAB — NOVEL CORONAVIRUS, NAA: SARS-CoV-2, NAA: DETECTED — AB

## 2019-03-24 NOTE — Telephone Encounter (Signed)
I have sent a work note to her MyChart. She can be set up for monoclonal antibody if her symptoms worsen. Has to be enrolled within 10 days of symptoms.

## 2019-03-24 NOTE — Telephone Encounter (Signed)
Incoming call from Patient wishing to verify covid testing results.  Provided covid results voiced understanding. Provided care  Advice.  Reviewed  Protocol for starting and stopping isolation.  Will notify HD

## 2019-03-24 NOTE — Telephone Encounter (Signed)
Patient was tested for Covid yesterday and results came back positive today.  She needs a work note.  Still has fever (around 101), cough and some shortness of breath.  Patient reports you had mentioned to her she may require IV????

## 2019-03-24 NOTE — Telephone Encounter (Signed)
Patient aware.

## 2019-03-24 NOTE — Telephone Encounter (Signed)
Patient states that she had fever yesterday but doesn't have one this morning. Patient advised as follows:   Temperature is the same and is noted to be less than 100.4: Continue to monitor at home.   Advise patient to stay hydrated, push oral fluids if able, and advise pt. to avoid using multiple blankets and layer of clothing to prevent overheating.   Avoid over use of anti-pyretic medications for fevers less than 101 degrees Fahrenheit.   Fever helps fight infection.   Worsening temperature, treat if temperature is > 101: treat with OTC anti-fever medications (Tylenol and/or Ibuprofen)   May alternate Tylenol and Ibuprofen every 3 hours as needed for fever greater than 101. Example: Tylenol at 9AM, Ibuprofen at 12PM, Tylenol at 3PM, Ibuprofen at 6PM, Tylenol at 9PM, Ibuprofen at 12AM, Tylenol at 3AM, Ibuprofen at 6AM. Then restart rotation with Tylenol at 9AM.   If fever remains for greater than 3 days, notify PCP.   If fever becomes greater than 103 and unable to reduce with over the counter medication, contact PCP.

## 2019-03-25 ENCOUNTER — Telehealth: Payer: Self-pay

## 2019-03-25 ENCOUNTER — Encounter (INDEPENDENT_AMBULATORY_CARE_PROVIDER_SITE_OTHER): Payer: Self-pay

## 2019-03-25 ENCOUNTER — Ambulatory Visit (HOSPITAL_COMMUNITY)
Admission: RE | Admit: 2019-03-25 | Discharge: 2019-03-25 | Disposition: A | Discharge status: Home / Self Care | Payer: 59 | Source: Ambulatory Visit | Attending: Pulmonary Disease | Admitting: Pulmonary Disease

## 2019-03-25 ENCOUNTER — Telehealth: Payer: Self-pay | Admitting: Infectious Diseases

## 2019-03-25 ENCOUNTER — Other Ambulatory Visit: Payer: Self-pay | Admitting: Infectious Diseases

## 2019-03-25 DIAGNOSIS — U071 COVID-19: Secondary | ICD-10-CM | POA: Diagnosis present

## 2019-03-25 DIAGNOSIS — Z6837 Body mass index (BMI) 37.0-37.9, adult: Secondary | ICD-10-CM

## 2019-03-25 MED ORDER — SODIUM CHLORIDE 0.9 % IV SOLN
700.0000 mg | Freq: Once | INTRAVENOUS | Status: AC
  Administered 2019-03-25: 700 mg via INTRAVENOUS
  Filled 2019-03-25: qty 20

## 2019-03-25 MED ORDER — SODIUM CHLORIDE 0.9 % IV SOLN
INTRAVENOUS | Status: DC | PRN
  Administered 2019-03-25: 13:00:00 250 mL via INTRAVENOUS

## 2019-03-25 MED ORDER — EPINEPHRINE 0.3 MG/0.3ML IJ SOAJ: 0.3000 mg | Freq: Once | INTRAMUSCULAR | Status: DC | PRN

## 2019-03-25 MED ORDER — METHYLPREDNISOLONE SODIUM SUCC 125 MG IJ SOLR: 125.0000 mg | Freq: Once | INTRAMUSCULAR | Status: DC | PRN

## 2019-03-25 MED ORDER — DIPHENHYDRAMINE HCL 50 MG/ML IJ SOLN: 50.0000 mg | Freq: Once | INTRAMUSCULAR | Status: DC | PRN

## 2019-03-25 MED ORDER — ALBUTEROL SULFATE HFA 108 (90 BASE) MCG/ACT IN AERS: 2.0000 | INHALATION_SPRAY | Freq: Once | RESPIRATORY_TRACT | Status: DC | PRN

## 2019-03-25 MED ORDER — FAMOTIDINE IN NACL 20-0.9 MG/50ML-% IV SOLN: 20.0000 mg | Freq: Once | INTRAVENOUS | Status: DC | PRN

## 2019-03-25 NOTE — Progress Notes (Signed)
  I connected by phone with Kim Reed on 03/25/2019 at 10:18 AM to discuss the potential use of an new treatment for mild to moderate COVID-19 viral infection in non-hospitalized patients.  This patient is a 54 y.o. female that meets the FDA criteria for Emergency Use Authorization of bamlanivimab or casirivimab\imdevimab.  Has a (+) direct SARS-CoV-2 viral test result  Has mild or moderate COVID-19   Is ? 54 years of age and weighs ? 40 kg  Is NOT hospitalized due to COVID-19  Is NOT requiring oxygen therapy or requiring an increase in baseline oxygen flow rate due to COVID-19  Is within 10 days of symptom onset  Has at least one of the high risk factor(s) for progression to severe COVID-19 and/or hospitalization as defined in EUA.  Specific high risk criteria : BMI >/= 35   I have spoken and communicated the following to the patient or parent/caregiver:  1. FDA has authorized the emergency use of bamlanivimab and casirivimab\imdevimab for the treatment of mild to moderate COVID-19 in adults and pediatric patients with positive results of direct SARS-CoV-2 viral testing who are 52 years of age and older weighing at least 40 kg, and who are at high risk for progressing to severe COVID-19 and/or hospitalization.  2. The significant known and potential risks and benefits of bamlanivimab and casirivimab\imdevimab, and the extent to which such potential risks and benefits are unknown.  3. Information on available alternative treatments and the risks and benefits of those alternatives, including clinical trials.  4. Patients treated with bamlanivimab and casirivimab\imdevimab should continue to self-isolate and use infection control measures (e.g., wear mask, isolate, social distance, avoid sharing personal items, clean and disinfect "high touch" surfaces, and frequent handwashing) according to CDC guidelines.   5. The patient or parent/caregiver has the option to accept or refuse  bamlanivimab or casirivimab\imdevimab .  After reviewing this information with the patient, The patient agreed to proceed with receiving the bamlanimivab infusion and will be provided a copy of the Fact sheet prior to receiving the infusion.Kim Reed 03/25/2019 10:18 AM

## 2019-03-25 NOTE — Progress Notes (Signed)
  Diagnosis: COVID-19  Physician: Dr. Asencion Noble  Procedure: Covid Infusion Clinic Med: bamlanivimab infusion - Provided patient with bamlanimivab fact sheet for patients, parents and caregivers prior to infusion.  Complications: No immediate complications noted.  Discharge: Discharged home   Kim Reed 03/25/2019

## 2019-03-25 NOTE — Discharge Instructions (Signed)

## 2019-03-25 NOTE — Telephone Encounter (Signed)
Called to discuss with patient about Covid symptoms and the use of bamlanivimab, a monoclonal antibody infusion for those with mild to moderate Covid symptoms and at a high risk of hospitalization.  Pt is qualified for this infusion at the [facility] infusion center due to BMI>35.   Still running temperatures of 102 deg. Taking tylenol for this but does not work. Symptoms started Sunday.  Tuesday she experienced some shortness of breath with cough and sore throat.  She does not have a pulse ox but denies any dizziness, mouth/hand tingling, persistent rapid heart rate. Just feels like she can't get all air out sometimes with the wheezing. She has been started on steroids and hopeful that they are helpful.   She would like to proceed with infusion - will call back with the time she can arrange transportation to the clinic. Discussed that if her oxygen is too low when she gets there then she should not get this infusion

## 2019-03-25 NOTE — Telephone Encounter (Signed)
Patient states that she had a fever and took Tylenol at 2 am and now temperature is 99. Patient also has some sob, cough, and sore throat. Patient will be reaching out to her pcp and also calling to see if she can start the infusion treatment. Patient was advise as follows:   Shortness of breath is the same: continue to monitor at home   If symptoms become severe, i.e. shortness of breath at rest, gasping for air, wheezing, CALL 911 AND SEEK TREATMENT IN THE ED    If cough remains the same or better: continue to treat with over the counter medications. Hard candy or cough drops and drinking warm fluids. Adults can also use honey 2 tsp (10 ML) at bedtime.   HONEY IS NOT RECOMMENDED FOR INFANTS UNDER ONE.    If cough is becoming worse even with the use of over the counter medications and patient is not able to sleep at night, cough becomes productive with sputum that maybe yellow or green in color, contact PCP.

## 2019-03-26 ENCOUNTER — Encounter (INDEPENDENT_AMBULATORY_CARE_PROVIDER_SITE_OTHER): Payer: Self-pay

## 2019-03-26 ENCOUNTER — Telehealth: Payer: Self-pay

## 2019-03-26 ENCOUNTER — Telehealth: Payer: Self-pay | Admitting: Infectious Diseases

## 2019-03-26 NOTE — Telephone Encounter (Signed)
Left a vm for patient to callback regarding mychart questionnaire  

## 2019-03-26 NOTE — Telephone Encounter (Signed)
Pt returned call regarding questionnaire. Will have nurse call her back.   [place]

## 2019-03-26 NOTE — Telephone Encounter (Signed)
Patient states that she tool Tylenol at 3am this morning and that she is at 101 and will take more medication at 8. Patient will follow up with pcp as she has had fevers all week. Patient advise as follows:   Temperature is the same and is noted to be less than 100.4: Continue to monitor at home.   Advise patient to stay hydrated, push oral fluids if able, and advise pt. to avoid using multiple blankets and layer of clothing to prevent overheating.   Avoid over use of anti-pyretic medications for fevers less than 101 degrees Fahrenheit.   Fever helps fight infection.   Worsening temperature, treat if temperature is > 101: treat with OTC anti-fever medications (Tylenol and/or Ibuprofen)   May alternate Tylenol and Ibuprofen every 3 hours as needed for fever greater than 101. Example: Tylenol at 9AM, Ibuprofen at 12PM, Tylenol at 3PM, Ibuprofen at 6PM, Tylenol at 9PM, Ibuprofen at 12AM, Tylenol at 3AM, Ibuprofen at 6AM. Then restart rotation with Tylenol at 9AM.   If fever remains for greater than 3 days, notify PCP.   If fever becomes greater than 103 and unable to reduce with over the counter medication, contact PCP.

## 2019-03-26 NOTE — Telephone Encounter (Signed)
Kim Reed received Bamlanivimab yesterday and today she realized a non-itchy flat rash on thighs and backs of arms, now on her face. She has no swelling to face/lips. Breathing is better today but still with fever to day to 101. She has had high fevers the whole time with her covid illness. She has also had sudden onset dizziness episode but reports no chest pain, normal pulse. Some diarrhea but able to keep foods/fluids down. She took a benadryl and is preparing to fall asleep now.   Difficult to say if she has a rash from COVID (which as been reported) vs delayed medication reaction to [place]. Will complete MedWatch form on her behalf and check in with her tomorrow.

## 2019-03-27 ENCOUNTER — Encounter (INDEPENDENT_AMBULATORY_CARE_PROVIDER_SITE_OTHER): Payer: Self-pay

## 2019-03-28 ENCOUNTER — Encounter (INDEPENDENT_AMBULATORY_CARE_PROVIDER_SITE_OTHER): Payer: Self-pay

## 2019-03-29 ENCOUNTER — Encounter (INDEPENDENT_AMBULATORY_CARE_PROVIDER_SITE_OTHER): Payer: Self-pay

## 2019-03-30 ENCOUNTER — Encounter (INDEPENDENT_AMBULATORY_CARE_PROVIDER_SITE_OTHER): Payer: Self-pay

## 2019-03-30 ENCOUNTER — Telehealth: Payer: Self-pay | Admitting: Family

## 2019-03-30 NOTE — Telephone Encounter (Signed)
What symptoms do you have? Pain in lower lungs and it's like a burning feeling.   How long have you been sick? 2 days  Have you been seen for this problem? NO Had a telelvisit with Christy last week.   If your provider decides to give you a prescription, which pharmacy would you like for it to be sent to? [facility]   Patient informed that this information will be sent to the clinical staff for review and that they should receive a follow up call.

## 2019-03-30 NOTE — Telephone Encounter (Signed)
Patient had a phone visit with Kim Reed 2/15 & was also tested positive for COVID on the same day.  Patient states that her other COVID symptoms are improving but x 2 days ago she started to have " lung discomfort/pain that is also making her lungs feel like they are burning". Patient is wanting to check with Chrisy to see if this is normal for COVID or if she would request her to get a chest xray? Please advise

## 2019-03-30 NOTE — Telephone Encounter (Signed)
Please make patient a telephone visit as she may need a chest x-ray

## 2019-03-31 ENCOUNTER — Encounter (INDEPENDENT_AMBULATORY_CARE_PROVIDER_SITE_OTHER): Payer: Self-pay

## 2019-03-31 NOTE — Telephone Encounter (Signed)
Today - pt states she feel MUCH better and does not think she needs further follow up - she will call us if she needs [place].

## 2019-04-01 ENCOUNTER — Encounter (INDEPENDENT_AMBULATORY_CARE_PROVIDER_SITE_OTHER): Payer: Self-pay

## 2019-04-20 ENCOUNTER — Telehealth: Payer: Self-pay | Admitting: Family

## 2019-04-21 NOTE — Telephone Encounter (Signed)
It will need to be 30 days.

## 2019-04-21 NOTE — Telephone Encounter (Signed)
No answer, mailbox full

## 2019-04-21 NOTE — Telephone Encounter (Signed)
Aware. 

## 2019-04-23 ENCOUNTER — Other Ambulatory Visit: Payer: Self-pay

## 2019-04-23 ENCOUNTER — Ambulatory Visit: Payer: 59 | Attending: Internal Medicine

## 2019-04-23 DIAGNOSIS — Z20822 Contact with and (suspected) exposure to covid-19: Secondary | ICD-10-CM

## 2019-04-24 LAB — NOVEL CORONAVIRUS, NAA: SARS-CoV-2, NAA: NOT DETECTED

## 2019-09-21 ENCOUNTER — Telehealth: Payer: Self-pay | Admitting: Obstetrics & Gynecology

## 2019-09-21 ENCOUNTER — Telehealth: Payer: Self-pay | Admitting: *Deleted

## 2019-09-21 NOTE — Telephone Encounter (Signed)
Patient states she has been bleeding since last Monday. She has not had a period in 5 months but has not experienced bleeding this long before.  Informed patient that since she has not been diagnosed with menopause, she could be perimenopausal which might explain why she has not had a period in 5 months.  She is due for a pap smear so Dr Elonda Husky could do this at her next visit if he felt it was necessary.  Pt verbalized understanding with all questions answered.

## 2019-09-21 NOTE — Telephone Encounter (Signed)
Having some bleeding made appt for DR Elonda Husky first appt for next week pt requested to see him, but wants a nurse to call her back about her bleeding and when is to much that she should be worried.

## 2019-10-02 ENCOUNTER — Encounter: Payer: Self-pay | Admitting: Obstetrics & Gynecology

## 2019-10-02 ENCOUNTER — Other Ambulatory Visit (HOSPITAL_COMMUNITY)
Admission: RE | Admit: 2019-10-02 | Discharge: 2019-10-02 | Disposition: A | Discharge status: Home / Self Care | Payer: 59 | Source: Ambulatory Visit | Attending: Obstetrics & Gynecology | Admitting: Obstetrics & Gynecology

## 2019-10-02 ENCOUNTER — Ambulatory Visit (INDEPENDENT_AMBULATORY_CARE_PROVIDER_SITE_OTHER): Payer: 59 | Admitting: Obstetrics & Gynecology

## 2019-10-02 VITALS — BP 96/61 | HR 77 | Ht 63.0 in | Wt 218.0 lb

## 2019-10-02 DIAGNOSIS — Z124 Encounter for screening for malignant neoplasm of cervix: Secondary | ICD-10-CM | POA: Insufficient documentation

## 2019-10-02 DIAGNOSIS — N924 Excessive bleeding in the premenopausal period: Secondary | ICD-10-CM

## 2019-10-02 MED ORDER — PROGESTERONE 200 MG PO CAPS: ORAL_CAPSULE | ORAL | 11 refills | Status: DC

## 2019-10-02 NOTE — Progress Notes (Signed)
Chief Complaint  Patient presents with  . discuss bleeding    went 6 months without a period, then started 8/10 and bled for 10 days; needs pap       54 y.o. [id number] Patient's last menstrual period was 09/15/2019. The current method of family planning is tubal ligation.  Outpatient Encounter Medications as of 10/02/2019  Medication Sig  . ibuprofen (ADVIL) 200 MG tablet Take 400 mg by mouth as needed for headache or moderate pain.   . Multiple Vitamin (MULTI VITAMIN PO) Take by mouth daily.   . progesterone (PROMETRIUM) 200 MG capsule Take as directed at bedtime  . [DISCONTINUED] albuterol (VENTOLIN HFA) 108 (90 Base) MCG/ACT inhaler Inhale 2 puffs into the lungs every 6 (six) hours as needed for wheezing or shortness of breath.  . [DISCONTINUED] dexamethasone (DECADRON) 6 MG tablet Take 1 tablet (6 mg total) by mouth daily.  . [DISCONTINUED] promethazine-dextromethorphan (PROMETHAZINE-DM) 6.25-15 MG/5ML syrup Take 5 mLs by mouth 3 (three) times daily as needed for cough.   No facility-administered encounter medications on file as of 10/02/2019.    Subjective Pt had been having relatively regular periods until 6 months ago, went that time without menses Then bled heaviy for 10 days clots cramps Stopped now  Past Medical History:  Diagnosis Date  . Endometriosis   . Ovarian cyst   . Plantar fasciitis, bilateral     Past Surgical History:  Procedure Laterality Date  . COLONOSCOPY  02/02/2019  . COLONOSCOPY N/A 02/02/2019   Procedure: COLONOSCOPY;  Surgeon: Danie Binder, MD;  Location: [facility];  Service: Endoscopy;  Laterality: N/A;  1:30  . EYE SURGERY    . POLYPECTOMY  02/02/2019   Procedure: POLYPECTOMY;  Surgeon: Danie Binder, MD;  Location: [facility];  Service: Endoscopy;;  descending colon  . TUBAL LIGATION      OB History    Gravida  2   Para  2   Term  2   Preterm      AB      Living  2     SAB      TAB      Ectopic       Multiple      Live Births  2           Allergies  Allergen Reactions  . Latex     rash    Social History   Socioeconomic History  . Marital status: Divorced    Spouse name: Not on file  . Number of children: Not on file  . Years of education: Not on file  . Highest education level: Not on file  Occupational History    Employer: [employer]  Tobacco Use  . Smoking status: Former Smoker    Types: Cigarettes    Quit date: 02/18/2008    Years since quitting: 11.6  . Smokeless tobacco: Never Used  Vaping Use  . Vaping Use: Never used  Substance and Sexual Activity  . Alcohol use: Yes    Alcohol/week: 0.0 standard drinks    Comment: socially  . Drug use: No  . Sexual activity: Yes    Birth control/protection: Surgical    Comment: tubal  Other Topics Concern  . Not on file  Social History Narrative  . Not on file   Social Determinants of Health   Financial Resource Strain:   . Difficulty of Paying Living Expenses: Not on file  Food Insecurity:   .  Worried About Charity fundraiser in the Last Year: Not on file  . Ran Out of Food in the Last Year: Not on file  Transportation Needs:   . Lack of Transportation (Medical): Not on file  . Lack of Transportation (Non-Medical): Not on file  Physical Activity:   . Days of Exercise per Week: Not on file  . Minutes of Exercise per Session: Not on file  Stress:   . Feeling of Stress : Not on file  Social Connections:   . Frequency of Communication with Friends and Family: Not on file  . Frequency of Social Gatherings with Friends and Family: Not on file  . Attends Religious Services: Not on file  . Active Member of Clubs or Organizations: Not on file  . Attends Archivist Meetings: Not on file  . Marital Status: Not on file    Family History  Problem Relation Age of Onset  . Thyroid disease Mother   . Colon polyps Mother        AGE > 29  . Cancer Father        prostate  . Thyroid  disease Father   . Colon polyps Father        AGE > 6  . Asthma Sister   . Other Sister        had heavy periods  . GER disease Brother   . Cancer Paternal Grandmother   . Alzheimer's disease Maternal Grandmother   . Heart attack Maternal Grandfather   . Breast cancer Other   . Colon cancer Other   . Colon cancer Paternal Uncle        AGE < 60    Medications:       Current Outpatient Medications:  .  ibuprofen (ADVIL) 200 MG tablet, Take 400 mg by mouth as needed for headache or moderate pain. , Disp: , Rfl:  .  Multiple Vitamin (MULTI VITAMIN PO), Take by mouth daily. , Disp: , Rfl:  .  progesterone (PROMETRIUM) 200 MG capsule, Take as directed at bedtime, Disp: 30 capsule, Rfl: 11  Objective Blood pressure 96/61, pulse 77, height 5\' 3"  (1.6 m), weight 218 lb (98.9 kg), last menstrual period 09/15/2019.  General WDWN female NAD Vulva:  normal appearing vulva with no masses, tenderness or lesions Vagina:  normal mucosa, no discharge Cervix:  Normal no lesions Uterus:  normal size, contour, position, consistency, mobility, non-tender Adnexa: ovaries:present,  normal adnexa in size, nontender and no masses   Pertinent ROS No burning with urination, frequency or urgency No nausea, vomiting or diarrhea Nor fever chills or other constitutional symptoms   Labs or studies     Impression Diagnoses this Encounter::   ICD-10-CM   1. Perimenopausal DUB  N92.4    will cycle monthly with prometrium, most likely for 2 years, may cut back to every 3 months if goes a year without bleeding  2. Routine cervical smear  Z12.4 Cytology - PAP( [facility])    Established relevant diagnosis(es):   Plan/Recommendations: Meds ordered this encounter  Medications  . progesterone (PROMETRIUM) 200 MG capsule    Sig: Take as directed at bedtime    Dispense:  30 capsule    Refill:  11    Labs or Scans Ordered: No orders of the defined types were placed in this  encounter.   Management:: See above note  Follow up Return in about 1 year (around 10/01/2020) for Follow up, with Dr Elonda Husky.  All questions were answered.

## 2019-10-05 LAB — CYTOLOGY - PAP
Adequacy: ABSENT
Chlamydia: NEGATIVE
Comment: NEGATIVE
Comment: NEGATIVE
Comment: NORMAL
Diagnosis: NEGATIVE
High risk HPV: NEGATIVE
Neisseria Gonorrhea: NEGATIVE

## 2019-10-09 ENCOUNTER — Telehealth: Payer: Self-pay | Admitting: *Deleted

## 2019-10-09 ENCOUNTER — Telehealth: Payer: Self-pay | Admitting: Obstetrics & Gynecology

## 2019-10-09 NOTE — Telephone Encounter (Signed)
Patient called stating that she had some a pap done on 8/27 and would like to know the results. Please contact pt

## 2019-10-09 NOTE — Telephone Encounter (Signed)
LMOVM that pap was normal.

## 2020-05-20 DIAGNOSIS — R200 Anesthesia of skin: Secondary | ICD-10-CM | POA: Diagnosis not present

## 2020-05-20 DIAGNOSIS — G540 Brachial plexus disorders: Secondary | ICD-10-CM | POA: Diagnosis not present

## 2020-05-20 DIAGNOSIS — R202 Paresthesia of skin: Secondary | ICD-10-CM | POA: Diagnosis not present

## 2020-05-20 DIAGNOSIS — N951 Menopausal and female climacteric states: Secondary | ICD-10-CM | POA: Diagnosis not present

## 2020-05-21 DIAGNOSIS — R202 Paresthesia of skin: Secondary | ICD-10-CM | POA: Diagnosis not present

## 2020-05-21 DIAGNOSIS — R200 Anesthesia of skin: Secondary | ICD-10-CM | POA: Diagnosis not present

## 2020-05-23 ENCOUNTER — Encounter: Payer: Self-pay | Admitting: Family Medicine

## 2020-05-23 ENCOUNTER — Other Ambulatory Visit: Payer: Self-pay

## 2020-05-23 ENCOUNTER — Ambulatory Visit (INDEPENDENT_AMBULATORY_CARE_PROVIDER_SITE_OTHER): Payer: BLUE CROSS/BLUE SHIELD | Admitting: Family Medicine

## 2020-05-23 VITALS — BP 111/65 | HR 70 | Temp 98.0°F | Ht 63.0 in | Wt 224.2 lb

## 2020-05-23 DIAGNOSIS — H6122 Impacted cerumen, left ear: Secondary | ICD-10-CM

## 2020-05-23 DIAGNOSIS — N951 Menopausal and female climacteric states: Secondary | ICD-10-CM

## 2020-05-23 DIAGNOSIS — R200 Anesthesia of skin: Secondary | ICD-10-CM | POA: Diagnosis not present

## 2020-05-23 NOTE — Progress Notes (Signed)
Subjective:  Patient ID: Kim Reed, female    DOB: [birth year]  Age: 55 y.o. MRN: [id number]  CC: Jaw numbness and tingling   HPI Kim Reed presents for left face numb and has pins&  Needles.  Onset 2 weeks ago there is no pain or weakness.  This is not affected her vision or her sense of taste.  She notes her daughter was diagnosed with MS 2 weeks ago.  Patient is concerned for hereditary potential.  She was given prednisone at urgent care three days ago. Getting some improvement.   Having night sweats. Last period was in December 4-5 months ago. Given clonidine for hot flashes, but didn't take it.   Depression screen [facility] 2/9 05/23/2020 02/03/2019 10/20/2018  Decreased Interest 0 0 0  Down, Depressed, Hopeless 0 0 0  PHQ - 2 Score 0 0 0    History Kim Reed has a past medical history of Endometriosis, Ovarian cyst, and Plantar fasciitis, bilateral.   She has a past surgical history that includes Tubal ligation; Eye surgery; Colonoscopy (02/02/2019); Colonoscopy (N/A, 02/02/2019); and polypectomy (02/02/2019).   Her family history includes Alzheimer's disease in her maternal grandmother; Asthma in her sister; Breast cancer in an other family member; Cancer in her father and paternal grandmother; Colon cancer in her paternal uncle and another family member; Colon polyps in her father and mother; GER disease in her brother; Heart attack in her maternal grandfather; Other in her sister; Thyroid disease in her father and mother.She reports that she quit smoking about 12 years ago. Her smoking use included cigarettes. She has never used smokeless tobacco. She reports current alcohol use. She reports that she does not use drugs.    ROS Review of Systems  Constitutional: Negative.   HENT: Negative.   Eyes: Negative for visual disturbance.  Respiratory: Negative for shortness of breath.   Cardiovascular: Negative for chest pain.  Gastrointestinal: Negative for abdominal pain.   Musculoskeletal: Negative for arthralgias.    Objective:  BP 111/65   Pulse 70   Temp 98 F (36.7 C)   Ht 5' 3"  (1.6 m)   Wt 224 lb 3.2 oz (101.7 kg)   SpO2 100%   BMI 39.72 kg/m   BP Readings from Last 3 Encounters:  05/23/20 111/65  10/02/19 96/61  03/25/19 115/61    Wt Readings from Last 3 Encounters:  05/23/20 224 lb 3.2 oz (101.7 kg)  10/02/19 218 lb (98.9 kg)  03/23/19 210 lb (95.3 kg)     Physical Exam Constitutional:      General: She is not in acute distress.    Appearance: She is well-developed.  HENT:     Head: Normocephalic and atraumatic.     Left Ear: There is impacted cerumen (Could not be removed by lavage X 2. ).  Eyes:     Conjunctiva/sclera: Conjunctivae normal.     Pupils: Pupils are equal, round, and reactive to light.  Neck:     Thyroid: No thyromegaly.  Cardiovascular:     Rate and Rhythm: Normal rate and regular rhythm.     Heart sounds: Normal heart sounds. No murmur heard.   Pulmonary:     Effort: Pulmonary effort is normal. No respiratory distress.     Breath sounds: Normal breath sounds. No wheezing or rales.  Abdominal:     General: Bowel sounds are normal. There is no distension.     Palpations: Abdomen is soft.     Tenderness: There is no abdominal  tenderness.  Musculoskeletal:        General: Normal range of motion.     Cervical back: Normal range of motion and neck supple.  Lymphadenopathy:     Cervical: No cervical adenopathy.  Skin:    General: Skin is warm and dry.  Neurological:     Mental Status: She is alert and oriented to person, place, and time.  Psychiatric:        Behavior: Behavior normal.        Thought Content: Thought content normal.        Judgment: Judgment normal.       Assessment & Plan:   Kim Reed was seen today for jaw numbness and tingling.  Diagnoses and all orders for this visit:  Hot flashes, menopausal -     CBC with Differential/Platelet -     CMP14+EGFR -     FSH/LH  Left facial  numbness   She should continue the prednisone.  Use Debrox for her ear.  Concerned that a cerumen impaction may be causing some pressure in the middle ear that may in fact be causing some inflammation of the left 7th nerve.    I have discontinued Clyde L. Diego's progesterone. I am also having her maintain her Multiple Vitamin (MULTI VITAMIN PO), ibuprofen, and prednisoLONE.  Allergies as of 05/23/2020      Reactions   Latex    rash      Medication List       Accurate as of May 23, 2020  2:45 PM. If you have any questions, ask your nurse or doctor.        STOP taking these medications   progesterone 200 MG capsule Commonly known as: Prometrium Stopped by: Claretta Fraise, MD     TAKE these medications   ibuprofen 200 MG tablet Commonly known as: ADVIL Take 400 mg by mouth as needed for headache or moderate pain.   MULTI VITAMIN PO Take by mouth daily.   prednisoLONE 5 MG Tabs tablet Take 5 mg by mouth.        Follow-up: Return if symptoms worsen or fail to improve.  Claretta Fraise, M.D.

## 2020-05-23 NOTE — Patient Instructions (Signed)
Earwax removal:  Debrox drops are available without a prescription at your pharmacy.  Lay on your side with the ear up that you want to treat. Place for 5 drops of the Debrox in the ear canal and lay still for 15 minutes. After that time you considered up and allow the excess to run out of the year and catch it with a Kleenex. Repeat this with the other ear if needed.  Repeat this process daily for 1 week. By that time the ear should feel less clogged and her hearing should be better, if not, follow up in the office for recheck of the ear.  Thanks, [employer]

## 2020-05-24 LAB — CMP14+EGFR
ALT: 12 IU/L (ref 0–32)
AST: 11 IU/L (ref 0–40)
Albumin/Globulin Ratio: 1.6 (ref 1.2–2.2)
Albumin: 4.2 g/dL (ref 3.8–4.9)
Alkaline Phosphatase: 125 IU/L — ABNORMAL HIGH (ref 44–121)
BUN/Creatinine Ratio: 26 — ABNORMAL HIGH (ref 9–23)
BUN: 18 mg/dL (ref 6–24)
Bilirubin Total: <0.2 mg/dL (ref 0.0–1.2)
CO2: 24 mmol/L (ref 20–29)
Calcium: 9 mg/dL (ref 8.7–10.2)
Chloride: 101 mmol/L (ref 96–106)
Creatinine, Ser: 0.68 mg/dL (ref 0.57–1.00)
Globulin, Total: 2.7 g/dL (ref 1.5–4.5)
Glucose: 88 mg/dL (ref 65–99)
Potassium: 3.9 mmol/L (ref 3.5–5.2)
Sodium: 142 mmol/L (ref 134–144)
Total Protein: 6.9 g/dL (ref 6.0–8.5)
eGFR: 105 mL/min/{1.73_m2} (ref >59)

## 2020-05-24 LAB — CBC WITH DIFFERENTIAL/PLATELET
Basophils Absolute: 0.1 10*3/uL (ref 0.0–0.2)
Basos: 1 %
EOS (ABSOLUTE): 0.1 10*3/uL (ref 0.0–0.4)
Eos: 1 %
Hematocrit: 38.8 % (ref 34.0–46.6)
Hemoglobin: 13 g/dL (ref 11.1–15.9)
Immature Grans (Abs): 0.1 10*3/uL (ref 0.0–0.1)
Immature Granulocytes: 1 %
Lymphocytes Absolute: 4.7 10*3/uL — ABNORMAL HIGH (ref 0.7–3.1)
Lymphs: 42 %
MCH: 30.5 pg (ref 26.6–33.0)
MCHC: 33.5 g/dL (ref 31.5–35.7)
MCV: 91 fL (ref 79–97)
Monocytes Absolute: 1.1 10*3/uL — ABNORMAL HIGH (ref 0.1–0.9)
Monocytes: 10 %
Neutrophils Absolute: 5.2 10*3/uL (ref 1.4–7.0)
Neutrophils: 45 %
Platelets: 326 10*3/uL (ref 150–450)
RBC: 4.26 x10E6/uL (ref 3.77–5.28)
RDW: 12.6 % (ref 11.7–15.4)
WBC: 11.3 10*3/uL — ABNORMAL HIGH (ref 3.4–10.8)

## 2020-05-24 LAB — FSH/LH
FSH: 56.7 m[IU]/mL
LH: 33.2 m[IU]/mL

## 2020-05-26 DIAGNOSIS — H6122 Impacted cerumen, left ear: Secondary | ICD-10-CM | POA: Diagnosis not present

## 2020-05-26 DIAGNOSIS — R202 Paresthesia of skin: Secondary | ICD-10-CM | POA: Diagnosis not present

## 2020-06-03 ENCOUNTER — Other Ambulatory Visit: Payer: Self-pay

## 2020-06-03 ENCOUNTER — Encounter: Payer: Self-pay | Admitting: Family

## 2020-06-03 ENCOUNTER — Ambulatory Visit (INDEPENDENT_AMBULATORY_CARE_PROVIDER_SITE_OTHER): Payer: BLUE CROSS/BLUE SHIELD | Admitting: Family

## 2020-06-03 VITALS — BP 115/66 | HR 71 | Temp 97.8°F | Ht 63.0 in | Wt 224.0 lb

## 2020-06-03 DIAGNOSIS — Z1159 Encounter for screening for other viral diseases: Secondary | ICD-10-CM

## 2020-06-03 DIAGNOSIS — E6609 Other obesity due to excess calories: Secondary | ICD-10-CM | POA: Diagnosis not present

## 2020-06-03 DIAGNOSIS — Z0000 Encounter for general adult medical examination without abnormal findings: Secondary | ICD-10-CM | POA: Diagnosis not present

## 2020-06-03 DIAGNOSIS — Z0001 Encounter for general adult medical examination with abnormal findings: Secondary | ICD-10-CM

## 2020-06-03 DIAGNOSIS — S20469A Insect bite (nonvenomous) of unspecified back wall of thorax, initial encounter: Secondary | ICD-10-CM | POA: Diagnosis not present

## 2020-06-03 DIAGNOSIS — Z6838 Body mass index (BMI) 38.0-38.9, adult: Secondary | ICD-10-CM

## 2020-06-03 DIAGNOSIS — W57XXXA Bitten or stung by nonvenomous insect and other nonvenomous arthropods, initial encounter: Secondary | ICD-10-CM

## 2020-06-03 NOTE — Patient Instructions (Signed)
Health Maintenance, Female Adopting a healthy lifestyle and getting preventive care are important in promoting health and wellness. Ask your health care provider about:  The right schedule for you to have regular tests and exams.  Things you can do on your own to prevent diseases and keep yourself healthy. What should I know about diet, weight, and exercise? Eat a healthy diet  Eat a diet that includes plenty of vegetables, fruits, low-fat dairy products, and lean protein.  Do not eat a lot of foods that are high in solid fats, added sugars, or sodium.   Maintain a healthy weight Body mass index (BMI) is used to identify weight problems. It estimates body fat based on height and weight. Your health care provider can help determine your BMI and help you achieve or maintain a healthy weight. Get regular exercise Get regular exercise. This is one of the most important things you can do for your health. Most adults should:  Exercise for at least 150 minutes each week. The exercise should increase your heart rate and make you sweat (moderate-intensity exercise).  Do strengthening exercises at least twice a week. This is in addition to the moderate-intensity exercise.  Spend less time sitting. Even light physical activity can be beneficial. Watch cholesterol and blood lipids Have your blood tested for lipids and cholesterol at 53 years of age, then have this test every 5 years. Have your cholesterol levels checked more often if:  Your lipid or cholesterol levels are high.  You are older than 53 years of age.  You are at high risk for heart disease. What should I know about cancer screening? Depending on your health history and family history, you may need to have cancer screening at various ages. This may include screening for:  Breast cancer.  Cervical cancer.  Colorectal cancer.  Skin cancer.  Lung cancer. What should I know about heart disease, diabetes, and high blood  pressure? Blood pressure and heart disease  High blood pressure causes heart disease and increases the risk of stroke. This is more likely to develop in people who have high blood pressure readings, are of African descent, or are overweight.  Have your blood pressure checked: ? Every 3-5 years if you are 18-39 years of age. ? Every year if you are 40 years old or older. Diabetes Have regular diabetes screenings. This checks your fasting blood sugar level. Have the screening done:  Once every three years after age 40 if you are at a normal weight and have a low risk for diabetes.  More often and at a younger age if you are overweight or have a high risk for diabetes. What should I know about preventing infection? Hepatitis B If you have a higher risk for hepatitis B, you should be screened for this virus. Talk with your health care provider to find out if you are at risk for hepatitis B infection. Hepatitis C Testing is recommended for:  Everyone born from 1945 through 1965.  Anyone with known risk factors for hepatitis C. Sexually transmitted infections (STIs)  Get screened for STIs, including gonorrhea and chlamydia, if: ? You are sexually active and are younger than 53 years of age. ? You are older than 53 years of age and your health care provider tells you that you are at risk for this type of infection. ? Your sexual activity has changed since you were last screened, and you are at increased risk for chlamydia or gonorrhea. Ask your health care provider   if you are at risk.  Ask your health care provider about whether you are at high risk for HIV. Your health care provider may recommend a prescription medicine to help prevent HIV infection. If you choose to take medicine to prevent HIV, you should first get tested for HIV. You should then be tested every 3 months for as long as you are taking the medicine. Pregnancy  If you are about to stop having your period (premenopausal) and  you may become pregnant, seek counseling before you get pregnant.  Take 400 to 800 micrograms (mcg) of folic acid every day if you become pregnant.  Ask for birth control (contraception) if you want to prevent pregnancy. Osteoporosis and menopause Osteoporosis is a disease in which the bones lose minerals and strength with aging. This can result in bone fractures. If you are 65 years old or older, or if you are at risk for osteoporosis and fractures, ask your health care provider if you should:  Be screened for bone loss.  Take a calcium or vitamin D supplement to lower your risk of fractures.  Be given hormone replacement therapy (HRT) to treat symptoms of menopause. Follow these instructions at home: Lifestyle  Do not use any products that contain nicotine or tobacco, such as cigarettes, e-cigarettes, and chewing tobacco. If you need help quitting, ask your health care provider.  Do not use street drugs.  Do not share needles.  Ask your health care provider for help if you need support or information about quitting drugs. Alcohol use  Do not drink alcohol if: ? Your health care provider tells you not to drink. ? You are pregnant, may be pregnant, or are planning to become pregnant.  If you drink alcohol: ? Limit how much you use to 0-1 drink a day. ? Limit intake if you are breastfeeding.  Be aware of how much alcohol is in your drink. In the [place] one drink equals one 12 oz bottle of beer (355 mL), one 5 oz glass of wine (148 mL), or one 1 oz glass of hard liquor (44 mL). General instructions  Schedule regular health, dental, and eye exams.  Stay current with your vaccines.  Tell your health care provider if: ? You often feel depressed. ? You have ever been abused or do not feel safe at home. Summary  Adopting a healthy lifestyle and getting preventive care are important in promoting health and wellness.  Follow your health care provider's instructions about healthy  diet, exercising, and getting tested or screened for diseases.  Follow your health care provider's instructions on monitoring your cholesterol and blood pressure. This information is not intended to replace advice given to you by your health care provider. Make sure you discuss any questions you have with your health care provider. Document Revised: 01/15/2018 Document Reviewed: 01/15/2018 Elsevier Patient Education  2021 [employer]  

## 2020-06-03 NOTE — Progress Notes (Signed)
Subjective:    Patient ID: Kim Reed, female    DOB: 10-May-1965, 55 y.o.   MRN: [id number]  Chief Complaint  Patient presents with  . Annual Exam    Jaw was like buzzing, got ear cleaned out and was put on prednisone. Hot flashes     HPI Pt presents to the office today for CPE with out pap. She is followed by GYN yearly. Pt denies any headache, palpitations, SOB, or edema at this time.   She reports she would like to be tested for Lyme disease. [place]. States her last tick she moved was on her back. Denies any fever, rash, or joint pain. Has fatigue.   She reports her ear tinnitus improved after she had the wax removed.    Review of Systems  All other systems reviewed and are negative.      Family History  Problem Relation Age of Onset  . Thyroid disease Mother   . Colon polyps Mother        AGE > 64  . Cancer Father        prostate  . Thyroid disease Father   . Colon polyps Father        AGE > 54  . Asthma Sister   . Other Sister        had heavy periods  . GER disease Brother   . Cancer Paternal Grandmother   . Alzheimer's disease Maternal Grandmother   . Heart attack Maternal Grandfather   . Breast cancer Other   . Colon cancer Other   . Colon cancer Paternal Uncle        AGE < 23   Social History   Socioeconomic History  . Marital status: Divorced    Spouse name: Not on file  . Number of children: Not on file  . Years of education: Not on file  . Highest education level: Not on file  Occupational History    Employer: [employer]  Tobacco Use  . Smoking status: Former Smoker    Types: Cigarettes    Quit date: 02/18/2008    Years since quitting: 12.2  . Smokeless tobacco: Never Used  Vaping Use  . Vaping Use: Never used  Substance and Sexual Activity  . Alcohol use: Yes    Alcohol/week: 0.0 standard drinks    Comment: socially  . Drug use: No  . Sexual activity: Yes     Birth control/protection: Surgical    Comment: tubal  Other Topics Concern  . Not on file  Social History Narrative  . Not on file   Social Determinants of Health   Financial Resource Strain: Not on file  Food Insecurity: Not on file  Transportation Needs: Not on file  Physical Activity: Not on file  Stress: Not on file  Social Connections: Not on file  Intimate Partner Violence: Not on file    Objective:   Physical Exam Vitals reviewed.  Constitutional:      General: She is not in acute distress.    Appearance: She is well-developed. She is obese.  HENT:     Head: Normocephalic and atraumatic.     Right Ear: Tympanic membrane normal.     Left Ear: Tympanic membrane normal.  Eyes:     Pupils: Pupils are equal, round, and reactive to light.  Neck:     Thyroid: No thyromegaly.  Cardiovascular:     Rate  and Rhythm: Normal rate and regular rhythm.     Heart sounds: Normal heart sounds. No murmur heard.   Pulmonary:     Effort: Pulmonary effort is normal. No respiratory distress.     Breath sounds: Normal breath sounds. No wheezing.  Abdominal:     General: Bowel sounds are normal. There is no distension.     Palpations: Abdomen is soft.     Tenderness: There is no abdominal tenderness.  Musculoskeletal:        General: No tenderness. Normal range of motion.     Cervical back: Normal range of motion and neck supple.  Skin:    General: Skin is warm and dry.  Neurological:     Mental Status: She is alert and oriented to person, place, and time.     Cranial Nerves: No cranial nerve deficit.     Deep Tendon Reflexes: Reflexes are normal and symmetric.  Psychiatric:        Behavior: Behavior normal.        Thought Content: Thought content normal.        Judgment: Judgment normal.       BP 115/66   Pulse 71   Temp 97.8 F (36.6 C) (Temporal)   Ht 5' 3"  (1.6 m)   Wt 224 lb (101.6 kg)   BMI 39.68 kg/m      Assessment & Plan:  [name] comes  in today with chief complaint of Annual Exam (Jaw was like buzzing, got ear cleaned out and was put on prednisone. Hot flashes )   Diagnosis and orders addressed:  1. Annual physical exam - Hepatitis C antibody - CMP14+EGFR - CBC with Differential/Platelet - Lipid panel - TSH - Lyme Ab/Western Blot Reflex  2. Class 2 obesity due to excess calories without serious comorbidity with body mass index (BMI) of 38.0 to 38.9 in adult - CMP14+EGFR - CBC with Differential/Platelet  3. Tick bite of back wall of thorax, unspecified location, initial encounter - CMP14+EGFR - CBC with Differential/Platelet - Lyme Ab/Western Blot Reflex  4. Need for hepatitis C screening test - Hepatitis C antibody - CMP14+EGFR - CBC with Differential/Platelet   Labs pending Health Maintenance reviewed Diet and exercise encouraged  Follow up plan: 1 year    Evelina Dun, FNP

## 2020-06-06 LAB — CMP14+EGFR
ALT: 11 IU/L (ref 0–32)
AST: 9 IU/L (ref 0–40)
Albumin/Globulin Ratio: 1.6 (ref 1.2–2.2)
Albumin: 4.1 g/dL (ref 3.8–4.9)
Alkaline Phosphatase: 128 IU/L — ABNORMAL HIGH (ref 44–121)
BUN/Creatinine Ratio: 14 (ref 9–23)
BUN: 9 mg/dL (ref 6–24)
Bilirubin Total: 0.3 mg/dL (ref 0.0–1.2)
CO2: 22 mmol/L (ref 20–29)
Calcium: 8.9 mg/dL (ref 8.7–10.2)
Chloride: 102 mmol/L (ref 96–106)
Creatinine, Ser: 0.65 mg/dL (ref 0.57–1.00)
Globulin, Total: 2.5 g/dL (ref 1.5–4.5)
Glucose: 100 mg/dL — ABNORMAL HIGH (ref 65–99)
Potassium: 4.1 mmol/L (ref 3.5–5.2)
Sodium: 139 mmol/L (ref 134–144)
Total Protein: 6.6 g/dL (ref 6.0–8.5)
eGFR: 106 mL/min/{1.73_m2} (ref >59)

## 2020-06-06 LAB — LIPID PANEL
Chol/HDL Ratio: 4.4 ratio (ref 0.0–4.4)
Cholesterol, Total: 201 mg/dL — ABNORMAL HIGH (ref 100–199)
HDL: 46 mg/dL (ref >39)
LDL Chol Calc (NIH): 109 mg/dL — ABNORMAL HIGH (ref 0–99)
Triglycerides: 266 mg/dL — ABNORMAL HIGH (ref 0–149)
VLDL Cholesterol Cal: 46 mg/dL — ABNORMAL HIGH (ref 5–40)

## 2020-06-06 LAB — TSH: TSH: 2.25 u[IU]/mL (ref 0.450–4.500)

## 2020-06-06 LAB — CBC WITH DIFFERENTIAL/PLATELET
Basophils Absolute: 0.1 10*3/uL (ref 0.0–0.2)
Basos: 1 %
EOS (ABSOLUTE): 0.1 10*3/uL (ref 0.0–0.4)
Eos: 1 %
Hematocrit: 40 % (ref 34.0–46.6)
Hemoglobin: 13.3 g/dL (ref 11.1–15.9)
Immature Grans (Abs): 0.1 10*3/uL (ref 0.0–0.1)
Immature Granulocytes: 1 %
Lymphocytes Absolute: 2.6 10*3/uL (ref 0.7–3.1)
Lymphs: 21 %
MCH: 31.5 pg (ref 26.6–33.0)
MCHC: 33.3 g/dL (ref 31.5–35.7)
MCV: 95 fL (ref 79–97)
Monocytes Absolute: 0.9 10*3/uL (ref 0.1–0.9)
Monocytes: 7 %
Neutrophils Absolute: 8.5 10*3/uL — ABNORMAL HIGH (ref 1.4–7.0)
Neutrophils: 69 %
Platelets: 302 10*3/uL (ref 150–450)
RBC: 4.22 x10E6/uL (ref 3.77–5.28)
RDW: 12.7 % (ref 11.7–15.4)
WBC: 12.2 10*3/uL — ABNORMAL HIGH (ref 3.4–10.8)

## 2020-06-06 LAB — LYME DISEASE SEROLOGY W/REFLEX: Lyme Total Antibody EIA: NEGATIVE

## 2020-06-06 LAB — HEPATITIS C ANTIBODY: Hep C Virus Ab: <0.1 s/co ratio (ref 0.0–0.9)

## 2020-06-13 ENCOUNTER — Ambulatory Visit (INDEPENDENT_AMBULATORY_CARE_PROVIDER_SITE_OTHER): Payer: BLUE CROSS/BLUE SHIELD | Admitting: Nurse Practitioner

## 2020-06-13 ENCOUNTER — Other Ambulatory Visit: Payer: Self-pay

## 2020-06-13 VITALS — BP 106/68 | HR 82 | Temp 97.2°F | Ht 63.0 in | Wt 224.0 lb

## 2020-06-13 DIAGNOSIS — J029 Acute pharyngitis, unspecified: Secondary | ICD-10-CM | POA: Diagnosis not present

## 2020-06-13 LAB — CULTURE, GROUP A STREP

## 2020-06-13 LAB — RAPID STREP SCREEN (MED CTR MEBANE ONLY): Strep Gp A Ag, IA W/Reflex: NEGATIVE

## 2020-06-13 MED ORDER — AMOXICILLIN 875 MG PO TABS: 875.0000 mg | ORAL_TABLET | Freq: Two times a day (BID) | ORAL | 0 refills | Status: AC

## 2020-06-13 NOTE — Patient Instructions (Signed)

## 2020-06-13 NOTE — Assessment & Plan Note (Signed)
Unresolved symptoms of sore throat in the last 24 hours.  With ear pain.  On assessment patient's tonsils swollen pharyngeal erythema and exudate with large tonsil stones present.  Left side of throat worse than right.  No fever or chills nausea or vomiting. Encourage patient to increase hydration, Tylenol/ibuprofen for pain.  Amoxicillin 875 mg tablet by mouth.  Completed strep swab.  Follow-up for worsening unresolved symptoms. Rx sent to pharmacy.

## 2020-06-13 NOTE — Progress Notes (Signed)
Acute Office Visit  Subjective:    Patient ID: Kim Reed, female    DOB: December 30, 1965, 55 y.o.   MRN: [id number]  Chief Complaint  Patient presents with  . Sore Throat    Sore Throat  This is a recurrent problem. The current episode started in the past 7 days. The problem has been unchanged. The pain is worse on the left side. There has been no fever. The pain is moderate. Associated symptoms include ear pain, neck pain and swollen glands. Pertinent negatives include no congestion, coughing, headaches or shortness of breath. She has had no exposure to strep or mono.     Past Medical History:  Diagnosis Date  . Endometriosis   . Ovarian cyst   . Plantar fasciitis, bilateral     Past Surgical History:  Procedure Laterality Date  . COLONOSCOPY  02/02/2019  . COLONOSCOPY N/A 02/02/2019   Procedure: COLONOSCOPY;  Surgeon: Danie Binder, MD;  Location: [facility];  Service: Endoscopy;  Laterality: N/A;  1:30  . EYE SURGERY    . POLYPECTOMY  02/02/2019   Procedure: POLYPECTOMY;  Surgeon: Danie Binder, MD;  Location: [facility];  Service: Endoscopy;;  descending colon  . TUBAL LIGATION      Family History  Problem Relation Age of Onset  . Thyroid disease Mother   . Colon polyps Mother        AGE > 67  . Cancer Father        prostate  . Thyroid disease Father   . Colon polyps Father        AGE > 52  . Asthma Sister   . Other Sister        had heavy periods  . GER disease Brother   . Cancer Paternal Grandmother   . Alzheimer's disease Maternal Grandmother   . Heart attack Maternal Grandfather   . Breast cancer Other   . Colon cancer Other   . Colon cancer Paternal Uncle        AGE < 63    Social History   Socioeconomic History  . Marital status: Divorced    Spouse name: Not on file  . Number of children: Not on file  . Years of education: Not on file  . Highest education level: Not on file  Occupational History    Employer: [employer]  Tobacco Use  . Smoking status: Former Smoker    Types: Cigarettes    Quit date: 02/18/2008    Years since quitting: 12.3  . Smokeless tobacco: Never Used  Vaping Use  . Vaping Use: Never used  Substance and Sexual Activity  . Alcohol use: Yes    Alcohol/week: 0.0 standard drinks    Comment: socially  . Drug use: No  . Sexual activity: Yes    Birth control/protection: Surgical    Comment: tubal  Other Topics Concern  . Not on file  Social History Narrative  . Not on file   Social Determinants of Health   Financial Resource Strain: Not on file  Food Insecurity: Not on file  Transportation Needs: Not on file  Physical Activity: Not on file  Stress: Not on file  Social Connections: Not on file  Intimate Partner Violence: Not on file    Outpatient Medications Prior to Visit  Medication Sig Dispense Refill  . Multiple Vitamin (MULTI VITAMIN PO) Take by mouth daily.      No facility-administered medications prior to visit.  Allergies  Allergen Reactions  . Latex     rash    Review of Systems  Constitutional: Negative.   HENT: Positive for ear pain. Negative for congestion.   Respiratory: Negative for cough and shortness of breath.   Cardiovascular: Negative.   Gastrointestinal: Negative.   Musculoskeletal: Positive for neck pain.  Neurological: Negative for headaches.  All other systems reviewed and are negative.      Objective:    Physical Exam Vitals reviewed.  Constitutional:      Appearance: She is well-developed.  HENT:     Head: Normocephalic.     Mouth/Throat:     Mouth: Mucous membranes are moist.     Pharynx: Pharyngeal swelling, posterior oropharyngeal erythema and uvula swelling present.  Cardiovascular:     Rate and Rhythm: Normal rate and regular rhythm.     Pulses: Normal pulses.     Heart sounds: Normal heart sounds.  Pulmonary:     Breath sounds: Normal breath sounds.  Abdominal:     General: Bowel sounds are normal.   Musculoskeletal:     Cervical back: Normal range of motion.  Skin:    Findings: No rash.  Neurological:     Mental Status: She is alert and oriented to person, place, and time.  Psychiatric:        Behavior: Behavior normal.     BP 106/68   Pulse 82   Temp (!) 97.2 F (36.2 C) (Temporal)   Ht 5' 3"  (1.6 m)   Wt 224 lb (101.6 kg)   SpO2 97%   BMI 39.68 kg/m  Wt Readings from Last 3 Encounters:  06/13/20 224 lb (101.6 kg)  06/03/20 224 lb (101.6 kg)  05/23/20 224 lb 3.2 oz (101.7 kg)    Health Maintenance Due  Topic Date Due  . COVID-19 Vaccine (3 - Booster for Moderna series) 01/19/2020    There are no preventive care reminders to display for this patient.   Lab Results  Component Value Date   TSH 2.250 06/03/2020   Lab Results  Component Value Date   WBC 12.2 (H) 06/03/2020   HGB 13.3 06/03/2020   HCT 40.0 06/03/2020   MCV 95 06/03/2020   PLT 302 06/03/2020   Lab Results  Component Value Date   NA 139 06/03/2020   K 4.1 06/03/2020   CO2 22 06/03/2020   GLUCOSE 100 (H) 06/03/2020   BUN 9 06/03/2020   CREATININE 0.65 06/03/2020   BILITOT 0.3 06/03/2020   ALKPHOS 128 (H) 06/03/2020   AST 9 06/03/2020   ALT 11 06/03/2020   PROT 6.6 06/03/2020   ALBUMIN 4.1 06/03/2020   CALCIUM 8.9 06/03/2020   EGFR 106 06/03/2020   Lab Results  Component Value Date   CHOL 201 (H) 06/03/2020   Lab Results  Component Value Date   HDL 46 06/03/2020   Lab Results  Component Value Date   LDLCALC 109 (H) 06/03/2020   Lab Results  Component Value Date   TRIG 266 (H) 06/03/2020   Lab Results  Component Value Date   CHOLHDL 4.4 06/03/2020   Lab Results  Component Value Date   HGBA1C 5.1 09/13/2015       Assessment & Plan:   Problem List Items Addressed This Visit      Other   Sore throat - Primary    Unresolved symptoms of sore throat in the last 24 hours.  With ear pain.  On assessment patient's tonsils swollen pharyngeal erythema and exudate  with  large tonsil stones present.  Left side of throat worse than right.  No fever or chills nausea or vomiting. Encourage patient to increase hydration, Tylenol/ibuprofen for pain.  Amoxicillin 875 mg tablet by mouth.  Completed strep swab.  Follow-up for worsening unresolved symptoms. Rx sent to pharmacy.      Relevant Medications   amoxicillin (AMOXIL) 875 MG tablet   Other Relevant Orders   Rapid Strep Screen ([facility] ONLY)       Meds ordered this encounter  Medications  . amoxicillin (AMOXIL) 875 MG tablet    Sig: Take 1 tablet (875 mg total) by mouth 2 (two) times daily for 10 days.    Dispense:  20 tablet    Refill:  0    Order Specific Question:   Supervising Provider    Answer:   Janora Norlander [[id number]]     Ivy Lynn, NP

## 2020-07-07 ENCOUNTER — Ambulatory Visit (INDEPENDENT_AMBULATORY_CARE_PROVIDER_SITE_OTHER): Payer: BLUE CROSS/BLUE SHIELD | Admitting: Family

## 2020-07-07 ENCOUNTER — Encounter: Payer: Self-pay | Admitting: Family

## 2020-07-07 ENCOUNTER — Telehealth: Payer: Self-pay | Admitting: Family

## 2020-07-07 DIAGNOSIS — Z20828 Contact with and (suspected) exposure to other viral communicable diseases: Secondary | ICD-10-CM | POA: Diagnosis not present

## 2020-07-07 DIAGNOSIS — R6889 Other general symptoms and signs: Secondary | ICD-10-CM

## 2020-07-07 MED ORDER — OSELTAMIVIR PHOSPHATE 75 MG PO CAPS: 75.0000 mg | ORAL_CAPSULE | Freq: Two times a day (BID) | ORAL | 0 refills | Status: DC

## 2020-07-07 NOTE — Progress Notes (Signed)
   Virtual Visit  Note Due to COVID-19 pandemic this visit was conducted virtually. This visit type was conducted due to national recommendations for restrictions regarding the COVID-19 Pandemic (e.g. social distancing, sheltering in place) in an effort to limit this patient's exposure and mitigate transmission in our community. All issues noted in this document were discussed and addressed.  A physical exam was not performed with this format.  I connected with Kim Reed on 07/07/20 at 10:41 AM  by telephone and verified that I am speaking with the correct person using two identifiers. Kim Reed is currently located at home and no one is currently with her during visit. The provider, Evelina Dun, FNP is located in their office at time of visit.  I discussed the limitations, risks, security and privacy concerns of performing an evaluation and management service by telephone and the availability of in person appointments. I also discussed with the patient that there may be a patient responsible charge related to this service. The patient expressed understanding and agreed to proceed.   History and Present Illness:  Pt presents today with cough that started last night. She reports her husband was diagnosed with Flu. She reports she took an at home COVID test that was negative.  Cough This is a new problem. The current episode started yesterday. The problem has been gradually worsening. The problem occurs every few minutes. The cough is non-productive. Associated symptoms include headaches, myalgias, nasal congestion and a sore throat. Pertinent negatives include no ear congestion, ear pain, fever, shortness of breath or wheezing. The symptoms are aggravated by lying down. She has tried nothing for the symptoms. The treatment provided mild relief.     Review of Systems  Constitutional: Negative for fever.  HENT: Positive for sore throat. Negative for ear pain.   Respiratory:  Positive for cough. Negative for shortness of breath and wheezing.   Musculoskeletal: Positive for myalgias.  Neurological: Positive for headaches.     Observations/Objective: No SOB or distress noted, tight cough  Assessment and Plan: 1. Exposure to influenza - oseltamivir (TAMIFLU) 75 MG capsule; Take 1 capsule (75 mg total) by mouth 2 (two) times daily.  Dispense: 10 capsule; Refill: 0  2. Flu-like symptoms Force fluids Tylenol as needed Start tamiflu today Rest Call if symptoms worsen or do not improve  - oseltamivir (TAMIFLU) 75 MG capsule; Take 1 capsule (75 mg total) by mouth 2 (two) times daily.  Dispense: 10 capsule; Refill: 0     I discussed the assessment and treatment plan with the patient. The patient was provided an opportunity to ask questions and all were answered. The patient agreed with the plan and demonstrated an understanding of the instructions.   The patient was advised to call back or seek an in-person evaluation if the symptoms worsen or if the condition fails to improve as anticipated.  The above assessment and management plan was discussed with the patient. The patient verbalized understanding of and has agreed to the management plan. Patient is aware to call the clinic if symptoms persist or worsen. Patient is aware when to return to the clinic for a follow-up visit. Patient educated on when it is appropriate to go to the emergency department.   Time call ended:  10:53 AM   I provided 12 minutes of  non face-to-face time during this encounter.    Evelina Dun, FNP

## 2020-07-07 NOTE — Telephone Encounter (Signed)
Pt is wanting something called in for cough. Had appt with [facility] 6/2

## 2020-07-08 MED ORDER — BENZONATATE 200 MG PO CAPS: 200.0000 mg | ORAL_CAPSULE | Freq: Three times a day (TID) | ORAL | 1 refills | Status: DC | PRN

## 2020-07-08 NOTE — Telephone Encounter (Signed)
Tessalon Prescription sent to pharmacy   

## 2020-07-08 NOTE — Telephone Encounter (Signed)
Patient aware.

## 2020-07-13 DIAGNOSIS — H35371 Puckering of macula, right eye: Secondary | ICD-10-CM | POA: Diagnosis not present

## 2020-07-13 DIAGNOSIS — H31093 Other chorioretinal scars, bilateral: Secondary | ICD-10-CM | POA: Diagnosis not present

## 2020-07-13 DIAGNOSIS — H25813 Combined forms of age-related cataract, bilateral: Secondary | ICD-10-CM | POA: Diagnosis not present

## 2020-07-13 DIAGNOSIS — H53002 Unspecified amblyopia, left eye: Secondary | ICD-10-CM | POA: Diagnosis not present

## 2020-07-13 DIAGNOSIS — H527 Unspecified disorder of refraction: Secondary | ICD-10-CM | POA: Diagnosis not present

## 2020-07-13 DIAGNOSIS — H52203 Unspecified astigmatism, bilateral: Secondary | ICD-10-CM | POA: Diagnosis not present

## 2020-07-25 DIAGNOSIS — H52203 Unspecified astigmatism, bilateral: Secondary | ICD-10-CM | POA: Diagnosis not present

## 2020-07-25 DIAGNOSIS — H53002 Unspecified amblyopia, left eye: Secondary | ICD-10-CM | POA: Diagnosis not present

## 2020-07-25 DIAGNOSIS — H25813 Combined forms of age-related cataract, bilateral: Secondary | ICD-10-CM | POA: Diagnosis not present

## 2020-07-25 DIAGNOSIS — H538 Other visual disturbances: Secondary | ICD-10-CM | POA: Diagnosis not present

## 2020-09-01 DIAGNOSIS — H35371 Puckering of macula, right eye: Secondary | ICD-10-CM | POA: Diagnosis not present

## 2020-09-01 DIAGNOSIS — H25813 Combined forms of age-related cataract, bilateral: Secondary | ICD-10-CM | POA: Diagnosis not present

## 2020-09-01 DIAGNOSIS — Q141 Congenital malformation of retina: Secondary | ICD-10-CM | POA: Diagnosis not present

## 2020-09-01 DIAGNOSIS — H02834 Dermatochalasis of left upper eyelid: Secondary | ICD-10-CM | POA: Diagnosis not present

## 2020-09-01 DIAGNOSIS — E669 Obesity, unspecified: Secondary | ICD-10-CM | POA: Diagnosis not present

## 2020-09-01 DIAGNOSIS — H02831 Dermatochalasis of right upper eyelid: Secondary | ICD-10-CM | POA: Diagnosis not present

## 2020-09-01 DIAGNOSIS — Z6839 Body mass index (BMI) 39.0-39.9, adult: Secondary | ICD-10-CM | POA: Diagnosis not present

## 2020-09-01 DIAGNOSIS — H25811 Combined forms of age-related cataract, right eye: Secondary | ICD-10-CM | POA: Diagnosis not present

## 2020-09-01 DIAGNOSIS — Z87891 Personal history of nicotine dependence: Secondary | ICD-10-CM | POA: Diagnosis not present

## 2020-09-01 DIAGNOSIS — H52223 Regular astigmatism, bilateral: Secondary | ICD-10-CM | POA: Diagnosis not present

## 2020-10-06 DIAGNOSIS — Z87891 Personal history of nicotine dependence: Secondary | ICD-10-CM | POA: Diagnosis not present

## 2020-10-06 DIAGNOSIS — Z961 Presence of intraocular lens: Secondary | ICD-10-CM | POA: Diagnosis not present

## 2020-10-06 DIAGNOSIS — Z9841 Cataract extraction status, right eye: Secondary | ICD-10-CM | POA: Diagnosis not present

## 2020-10-06 DIAGNOSIS — H25812 Combined forms of age-related cataract, left eye: Secondary | ICD-10-CM | POA: Diagnosis not present

## 2020-10-06 DIAGNOSIS — Z91040 Latex allergy status: Secondary | ICD-10-CM | POA: Diagnosis not present

## 2020-10-06 DIAGNOSIS — H52202 Unspecified astigmatism, left eye: Secondary | ICD-10-CM | POA: Diagnosis not present

## 2020-12-05 ENCOUNTER — Ambulatory Visit: Payer: BLUE CROSS/BLUE SHIELD | Admitting: Family Medicine

## 2020-12-05 ENCOUNTER — Encounter: Payer: Self-pay | Admitting: Nurse Practitioner

## 2020-12-05 ENCOUNTER — Ambulatory Visit (INDEPENDENT_AMBULATORY_CARE_PROVIDER_SITE_OTHER): Payer: BLUE CROSS/BLUE SHIELD | Admitting: Nurse Practitioner

## 2020-12-05 ENCOUNTER — Other Ambulatory Visit: Payer: Self-pay

## 2020-12-05 VITALS — BP 121/70 | HR 73 | Temp 97.1°F | Resp 20 | Wt 227.0 lb

## 2020-12-05 DIAGNOSIS — M25559 Pain in unspecified hip: Secondary | ICD-10-CM | POA: Diagnosis not present

## 2020-12-05 DIAGNOSIS — R200 Anesthesia of skin: Secondary | ICD-10-CM | POA: Diagnosis not present

## 2020-12-05 DIAGNOSIS — R5383 Other fatigue: Secondary | ICD-10-CM | POA: Diagnosis not present

## 2020-12-05 DIAGNOSIS — Z23 Encounter for immunization: Secondary | ICD-10-CM | POA: Diagnosis not present

## 2020-12-05 LAB — CBC WITH DIFFERENTIAL/PLATELET
Basophils Absolute: 0.1 10*3/uL (ref 0.0–0.2)
Basos: 1 %
EOS (ABSOLUTE): 0.2 10*3/uL (ref 0.0–0.4)
Eos: 2 %
Hematocrit: 41.4 % (ref 34.0–46.6)
Hemoglobin: 13.7 g/dL (ref 11.1–15.9)
Immature Grans (Abs): 0.1 10*3/uL (ref 0.0–0.1)
Immature Granulocytes: 1 %
Lymphocytes Absolute: 2.1 10*3/uL (ref 0.7–3.1)
Lymphs: 26 %
MCH: 30.9 pg (ref 26.6–33.0)
MCHC: 33.1 g/dL (ref 31.5–35.7)
MCV: 93 fL (ref 79–97)
Monocytes Absolute: 0.8 10*3/uL (ref 0.1–0.9)
Monocytes: 10 %
Neutrophils Absolute: 4.8 10*3/uL (ref 1.4–7.0)
Neutrophils: 60 %
Platelets: 316 10*3/uL (ref 150–450)
RBC: 4.44 x10E6/uL (ref 3.77–5.28)
RDW: 12.6 % (ref 11.7–15.4)
WBC: 7.9 10*3/uL (ref 3.4–10.8)

## 2020-12-05 LAB — COMPREHENSIVE METABOLIC PANEL
ALT: 13 IU/L (ref 0–32)
AST: 14 IU/L (ref 0–40)
Albumin/Globulin Ratio: 1.6 (ref 1.2–2.2)
Albumin: 4.5 g/dL (ref 3.8–4.9)
Alkaline Phosphatase: 128 IU/L — ABNORMAL HIGH (ref 44–121)
BUN/Creatinine Ratio: 19 (ref 9–23)
BUN: 13 mg/dL (ref 6–24)
Bilirubin Total: 0.3 mg/dL (ref 0.0–1.2)
CO2: 25 mmol/L (ref 20–29)
Calcium: 9.4 mg/dL (ref 8.7–10.2)
Chloride: 103 mmol/L (ref 96–106)
Creatinine, Ser: 0.69 mg/dL (ref 0.57–1.00)
Globulin, Total: 2.8 g/dL (ref 1.5–4.5)
Glucose: 99 mg/dL (ref 70–99)
Potassium: 4.6 mmol/L (ref 3.5–5.2)
Sodium: 143 mmol/L (ref 134–144)
Total Protein: 7.3 g/dL (ref 6.0–8.5)
eGFR: 104 mL/min/{1.73_m2} (ref >59)

## 2020-12-05 NOTE — Patient Instructions (Signed)
Fatigue If you have fatigue, you feel tired all the time and have a lack of energy or a lack of motivation. Fatigue may make it difficult to start or complete tasks because of exhaustion. In general, occasional or mild fatigue is often a normal response to activity or life. However, long-lasting (chronic) or extreme fatigue may be a symptom of a medical condition. Follow these instructions at home: General instructions Watch your fatigue for any changes. Go to bed and get up at the same time every day. Avoid fatigue by pacing yourself during the day and getting enough sleep at night. Maintain a healthy weight. Medicines Take over-the-counter and prescription medicines only as told by your health care provider. Take a multivitamin, if told by your health care provider.  Do not use herbal or dietary supplements unless they are approved by your health care provider. Activity  Exercise regularly, as told by your health care provider. Use or practice techniques to help you relax, such as yoga, tai chi, meditation, or massage therapy. Eating and drinking  Avoid heavy meals in the evening. Eat a well-balanced diet, which includes lean proteins, whole grains, plenty of fruits and vegetables, and low-fat dairy products. Avoid consuming too much caffeine. Avoid the use of alcohol. Drink enough fluid to keep your urine pale yellow. Lifestyle Change situations that cause you stress. Try to keep your work and personal schedule in balance. Do not use any products that contain nicotine or tobacco, such as cigarettes and e-cigarettes. If you need help quitting, ask your health care provider. Do not use drugs. Contact a health care provider if: Your fatigue does not get better. You have a fever. You suddenly lose or gain weight. You have headaches. You have trouble falling asleep or sleeping through the night. You feel angry, guilty, anxious, or sad. You are unable to have a bowel movement  (constipation). Your skin is dry. You have swelling in your legs or another part of your body. Get help right away if: You feel confused. Your vision is blurry. You feel faint or you pass out. You have a severe headache. You have severe pain in your abdomen, your back, or the area between your waist and hips (pelvis). You have chest pain, shortness of breath, or an irregular or fast heartbeat. You are unable to urinate, or you urinate less than normal. You have abnormal bleeding, such as bleeding from the rectum, vagina, nose, lungs, or nipples. You vomit blood. You have thoughts about hurting yourself or others. If you ever feel like you may hurt yourself or others, or have thoughts about taking your own life, get help right away. You can go to your nearest emergency department or call: Your local emergency services (911 in the [place] A suicide crisis helpline, such as the [place] [facility] at 339-852-4039. This is open 24 hours a day. Summary If you have fatigue, you feel tired all the time and have a lack of energy or a lack of motivation. Fatigue may make it difficult to start or complete tasks because of exhaustion. Long-lasting (chronic) or extreme fatigue may be a symptom of a medical condition. Exercise regularly, as told by your health care provider. Change situations that cause you stress. Try to keep your work and personal schedule in balance. This information is not intended to replace advice given to you by your health care provider. Make sure you discuss any questions you have with your health care provider. Document Revised: 12/03/2019 Document Reviewed: 12/03/2019 Elsevier  Patient Education  2022 [place]. Hip Pain The hip is the joint between the upper legs and the lower pelvis. The bones, cartilage, tendons, and muscles of your hip joint support your body and allow you to move around. Hip pain can range from a minor ache to severe pain in one or  both of your hips. The pain may be felt on the inside of the hip joint near the groin, or on the outside near the buttocks and upper thigh. You may also have swelling or stiffness in your hip area. Follow these instructions at home: Managing pain, stiffness, and swelling   If directed, put ice on the painful area. To do this: Put ice in a plastic bag. Place a towel between your skin and the bag. Leave the ice on for 20 minutes, 2-3 times a day. If directed, apply heat to the affected area as often as told by your health care provider. Use the heat source that your health care provider recommends, such as a moist heat pack or a heating pad. Place a towel between your skin and the heat source. Leave the heat on for 20-30 minutes. Remove the heat if your skin turns bright red. This is especially important if you are unable to feel pain, heat, or cold. You may have a greater risk of getting burned. Activity Do exercises as told by your health care provider. Avoid activities that cause pain. General instructions  Take over-the-counter and prescription medicines only as told by your health care provider. Keep a journal of your symptoms. Write down: How often you have hip pain. The location of your pain. What the pain feels like. What makes the pain worse. Sleep with a pillow between your legs on your most comfortable side. Keep all follow-up visits as told by your health care provider. This is important. Contact a health care provider if: You cannot put weight on your leg. Your pain or swelling continues or gets worse after one week. It gets harder to walk. You have a fever. Get help right away if: You fall. You have a sudden increase in pain and swelling in your hip. Your hip is red or swollen or very tender to touch. Summary Hip pain can range from a minor ache to severe pain in one or both of your hips. The pain may be felt on the inside of the hip joint near the groin, or on the  outside near the buttocks and upper thigh. Avoid activities that cause pain. Write down how often you have hip pain, the location of the pain, what makes it worse, and what it feels like. This information is not intended to replace advice given to you by your health care provider. Make sure you discuss any questions you have with your health care provider. Document Revised: 06/09/2018 Document Reviewed: 06/09/2018 Elsevier Patient Education  [place].

## 2020-12-05 NOTE — Assessment & Plan Note (Signed)
Unresolved hip pain in the past 2 months.  Patient is now willing to use any NSAIDs or steroids today.  Patient will prefer conservative treatments with the use of ice or heating pads.  Patient will follow-up if symptoms worsens.  Education provided to patient printed handouts given.  Follow-up with unresolved symptoms.

## 2020-12-05 NOTE — Progress Notes (Signed)
Acute Office Visit  Subjective:    Patient ID: Kim Reed, female    DOB: Sep 03, 1965, 55 y.o.   MRN: [id number]  Chief Complaint  Patient presents with   Fatigue, numbness in tinging in legs and face, muscle tight    Hip Pain  Associated symptoms include numbness.  Patient is in today for Pain  She reports recurrent right hip pain. was not an injury that may have caused the pain. The pain started a few months ago and is staying constant. The pain does radiate right buttocks. The pain is described as aching and soreness, is 5/10 in intensity, occurring intermittently. Symptoms are worse in the: morning  Aggravating factors: recumbency Relieving factors: walking.  She has tried application of ice with no relief.   Fatigue  She reports recurrent fatigue which she describes as a lack of energy, feeling exhausted, and feeling weak. It began a few weeks ago and occurs all the time. It is described as moderate and staying constant. She has not started new medications around the time the fatigue started.   Associated symptoms: No arthralgias No bleeding  No melena No chest discomfort  No heart palpitations No heart racing   No dyspnea No feeling depressed  No feeling anxious or under stress No fevers  No loss of appetite No nausea  No vomiting No sleeping problems    Wt Readings from Last 3 Encounters:  12/05/20 227 lb (103 kg)  06/13/20 224 lb (101.6 kg)  06/03/20 224 lb (101.6 kg)    Lab Results  Component Value Date   WBC 12.2 (H) 06/03/2020   HGB 13.3 06/03/2020   HCT 40.0 06/03/2020   MCV 95 06/03/2020   PLT 302 06/03/2020   Lab Results  Component Value Date   TSH 2.250 06/03/2020   Lab Results  Component Value Date   NA 139 06/03/2020   K 4.1 06/03/2020   CO2 22 06/03/2020   BUN 9 06/03/2020   CREATININE 0.65 06/03/2020   CALCIUM 8.9 06/03/2020   GLUCOSE 100 (H) 06/03/2020     Past Medical History:  Diagnosis Date   Endometriosis    Ovarian  cyst    Plantar fasciitis, bilateral     Past Surgical History:  Procedure Laterality Date   COLONOSCOPY  02/02/2019   COLONOSCOPY N/A 02/02/2019   Procedure: COLONOSCOPY;  Surgeon: Danie Binder, MD;  Location: [facility];  Service: Endoscopy;  Laterality: N/A;  1:30   EYE SURGERY     POLYPECTOMY  02/02/2019   Procedure: POLYPECTOMY;  Surgeon: Danie Binder, MD;  Location: [facility];  Service: Endoscopy;;  descending colon   TUBAL LIGATION      Family History  Problem Relation Age of Onset   Thyroid disease Mother    Colon polyps Mother        AGE > 23   Cancer Father        prostate   Thyroid disease Father    Colon polyps Father        AGE > 35   Asthma Sister    Other Sister        had heavy periods   GER disease Brother    Cancer Paternal Grandmother    Alzheimer's disease Maternal Grandmother    Heart attack Maternal Grandfather    Breast cancer Other    Colon cancer Other    Colon cancer Paternal Uncle        AGE < 64  Social History   Socioeconomic History   Marital status: Divorced    Spouse name: Not on file   Number of children: Not on file   Years of education: Not on file   Highest education level: Not on file  Occupational History    Employer: [employer]  Tobacco Use   Smoking status: Former    Types: Cigarettes    Quit date: 02/18/2008    Years since quitting: 12.8   Smokeless tobacco: Never  Vaping Use   Vaping Use: Never used  Substance and Sexual Activity   Alcohol use: Yes    Alcohol/week: 0.0 standard drinks    Comment: socially   Drug use: No   Sexual activity: Yes    Birth control/protection: Surgical    Comment: tubal  Other Topics Concern   Not on file  Social History Narrative   Not on file   Social Determinants of Health   Financial Resource Strain: Not on file  Food Insecurity: Not on file  Transportation Needs: Not on file  Physical Activity: Not on file  Stress: Not on file  Social  Connections: Not on file  Intimate Partner Violence: Not on file    Outpatient Medications Prior to Visit  Medication Sig Dispense Refill   Multiple Vitamin (MULTI VITAMIN PO) Take by mouth daily.      benzonatate (TESSALON) 200 MG capsule Take 1 capsule (200 mg total) by mouth 3 (three) times daily as needed. 30 capsule 1   oseltamivir (TAMIFLU) 75 MG capsule Take 1 capsule (75 mg total) by mouth 2 (two) times daily. 10 capsule 0   No facility-administered medications prior to visit.    Allergies  Allergen Reactions   Latex     rash    Review of Systems  Constitutional: Negative.   HENT: Negative.    Eyes: Negative.   Respiratory: Negative.    Cardiovascular: Negative.   Gastrointestinal: Negative.   Musculoskeletal:  Positive for arthralgias and myalgias.  Skin:  Negative for rash.  Neurological:  Positive for numbness.      Objective:    Physical Exam Vitals and nursing note reviewed.  Constitutional:      Appearance: Normal appearance.  HENT:     Head: Normocephalic.     Right Ear: Ear canal and external ear normal.     Mouth/Throat:     Mouth: Mucous membranes are moist.     Pharynx: Oropharynx is clear.  Eyes:     Conjunctiva/sclera: Conjunctivae normal.  Cardiovascular:     Rate and Rhythm: Normal rate and regular rhythm.     Pulses: Normal pulses.     Heart sounds: Normal heart sounds.  Abdominal:     General: Bowel sounds are normal.  Musculoskeletal:     Lumbar back: Tenderness present.       Back:  Skin:    Findings: No rash.  Neurological:     Mental Status: She is alert and oriented to person, place, and time.     Motor: Motor function is intact. No weakness.     Coordination: Coordination is intact.     Gait: Gait is intact.  Psychiatric:        Mood and Affect: Mood normal.        Behavior: Behavior normal.    BP 121/70   Pulse 73   Temp (!) 97.1 F (36.2 C) (Temporal)   Resp 20   Wt 227 lb (103 kg)   SpO2 98%  BMI 40.21 kg/m   Wt Readings from Last 3 Encounters:  12/05/20 227 lb (103 kg)  06/13/20 224 lb (101.6 kg)  06/03/20 224 lb (101.6 kg)    Health Maintenance Due  Topic Date Due   COVID-19 Vaccine (3 - Booster for Moderna series) 09/14/2019   MAMMOGRAM  11/09/2020    There are no preventive care reminders to display for this patient.   Lab Results  Component Value Date   TSH 2.250 06/03/2020   Lab Results  Component Value Date   WBC 12.2 (H) 06/03/2020   HGB 13.3 06/03/2020   HCT 40.0 06/03/2020   MCV 95 06/03/2020   PLT 302 06/03/2020   Lab Results  Component Value Date   NA 139 06/03/2020   K 4.1 06/03/2020   CO2 22 06/03/2020   GLUCOSE 100 (H) 06/03/2020   BUN 9 06/03/2020   CREATININE 0.65 06/03/2020   BILITOT 0.3 06/03/2020   ALKPHOS 128 (H) 06/03/2020   AST 9 06/03/2020   ALT 11 06/03/2020   PROT 6.6 06/03/2020   ALBUMIN 4.1 06/03/2020   CALCIUM 8.9 06/03/2020   EGFR 106 06/03/2020   Lab Results  Component Value Date   CHOL 201 (H) 06/03/2020   Lab Results  Component Value Date   HDL 46 06/03/2020   Lab Results  Component Value Date   LDLCALC 109 (H) 06/03/2020   Lab Results  Component Value Date   TRIG 266 (H) 06/03/2020   Lab Results  Component Value Date   CHOLHDL 4.4 06/03/2020   Lab Results  Component Value Date   HGBA1C 5.1 09/13/2015       Assessment & Plan:   Problem List Items Addressed This Visit       Other   Other fatigue    Worsening unresolved fatigue in the past few weeks.  In the past few weeks patient is reporting worsening facial tingling and muscle weakness.  Patient is concerned about multiple sclerosis as her daughter was recently diagnosed.  Completed neurochecks patient within normal limits.  Completed referral to neurology.  CBC to rule out anemia, increase hydration, sleep, exercise and a balanced healthy diet recommended. Patient verbalized understanding.  Printed handouts given.      Relevant Orders   CBC with  Differential   Comprehensive metabolic panel   Hip pain - Primary    Unresolved hip pain in the past 2 months.  Patient is now willing to use any NSAIDs or steroids today.  Patient will prefer conservative treatments with the use of ice or heating pads.  Patient will follow-up if symptoms worsens.  Education provided to patient printed handouts given.  Follow-up with unresolved symptoms.      Other Visit Diagnoses     Left facial numbness       Relevant Orders   Ambulatory referral to Neurology        No orders of the defined types were placed in this encounter.    Ivy Lynn, NP

## 2020-12-05 NOTE — Assessment & Plan Note (Signed)
Worsening unresolved fatigue in the past few weeks.  In the past few weeks patient is reporting worsening facial tingling and muscle weakness.  Patient is concerned about multiple sclerosis as her daughter was recently diagnosed.  Completed neurochecks patient within normal limits.  Completed referral to neurology.  CBC to rule out anemia, increase hydration, sleep, exercise and a balanced healthy diet recommended. Patient verbalized understanding.  Printed handouts given.

## 2021-01-17 ENCOUNTER — Other Ambulatory Visit (HOSPITAL_COMMUNITY): Payer: Self-pay | Admitting: Neurology

## 2021-01-17 ENCOUNTER — Other Ambulatory Visit: Payer: Self-pay | Admitting: Neurology

## 2021-01-17 DIAGNOSIS — G35 Multiple sclerosis: Secondary | ICD-10-CM

## 2021-01-21 ENCOUNTER — Encounter (HOSPITAL_COMMUNITY): Payer: Self-pay

## 2021-01-21 ENCOUNTER — Other Ambulatory Visit: Payer: Self-pay

## 2021-01-21 ENCOUNTER — Ambulatory Visit (HOSPITAL_COMMUNITY)
Admission: RE | Admit: 2021-01-21 | Discharge: 2021-01-21 | Disposition: A | Discharge status: Home / Self Care | Payer: BLUE CROSS/BLUE SHIELD | Source: Ambulatory Visit | Attending: Neurology | Admitting: Neurology

## 2021-01-21 ENCOUNTER — Ambulatory Visit (HOSPITAL_COMMUNITY): Payer: BLUE CROSS/BLUE SHIELD

## 2021-01-21 DIAGNOSIS — G35 Multiple sclerosis: Secondary | ICD-10-CM | POA: Diagnosis not present

## 2021-01-21 IMAGING — MR MR CERVICAL SPINE WO/W CM
6 of 8 series · 29 of 48 positions shown · IV contrast (gadavist)
Comparison: None.

CLINICAL DATA: Concern for multiple sclerosis.

EXAM:
MRI HEAD WITHOUT AND WITH CONTRAST
MRI CERVICAL SPINE WITHOUT AND WITH CONTRAST
TECHNIQUE: Multiplanar, multiecho pulse sequences of the brain and surrounding
structures, and cervical spine, to include the craniocervical
junction and cervicothoracic junction, were obtained without and
with intravenous contrast.
CONTRAST:  10mL GADAVIST GADOBUTROL 1 MMOL/ML IV SOLN

[Series 21: T2 · sagittal · 3.0mm · 0.69mm/px · 3 of 12 slices shown (1 of 2)]
[im 1/12]
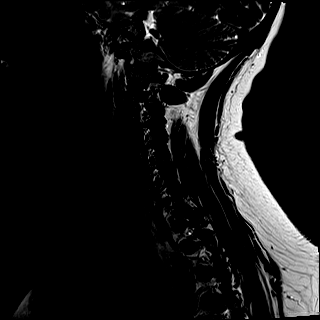
[im 6/12]
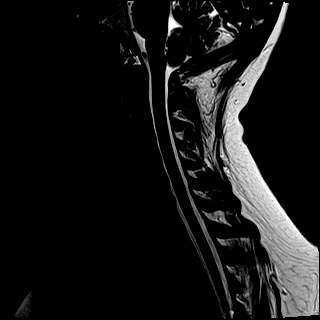
[im 12/12]
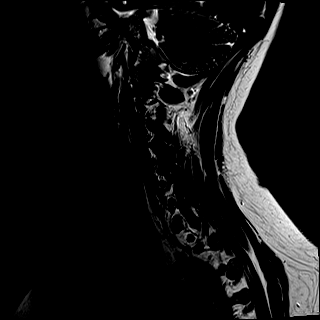

[Series 22: T1 · sagittal · 3.0mm · 0.69mm/px · 3 of 12 slices shown (1 of 2)]
[im 1/12]
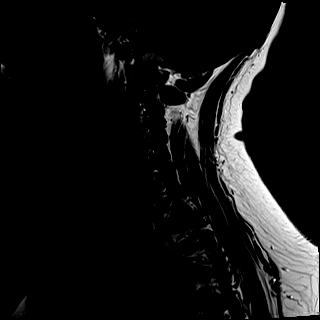
[im 6/12]
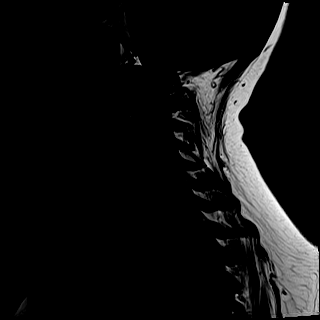
[im 12/12]
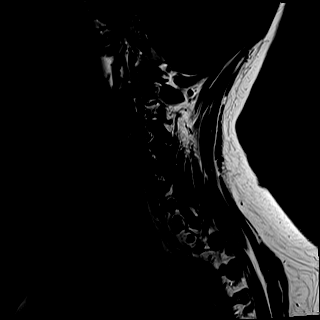

[Series 23: STIR · sagittal · 3.0mm · 0.86mm/px · 2 of 12 slices shown]
[im 1/12]
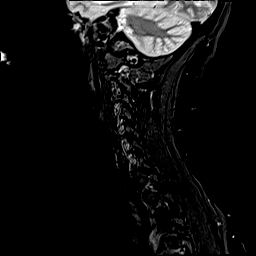
[im 6/12]
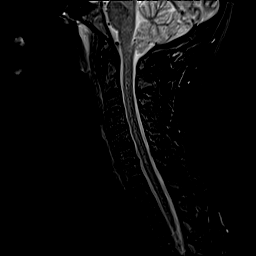

[Series 24: T2 · axial · 3.0mm · 0.66mm/px · z∈[-257,-167]mm · 9 of 30 slices shown (2 of 2)]
[im 1/30]
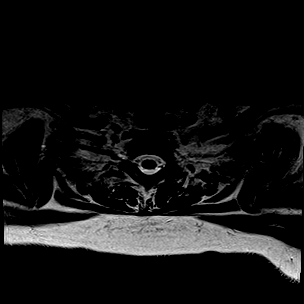
[im 4/30]
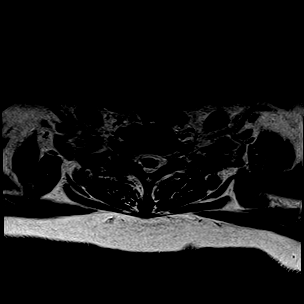
[im 8/30]
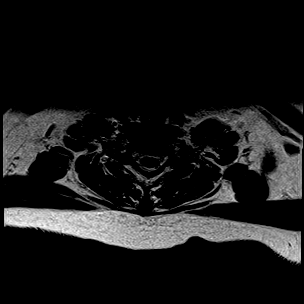
[im 11/30]
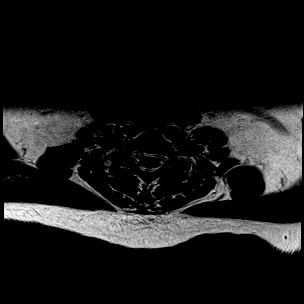
[im 15/30]
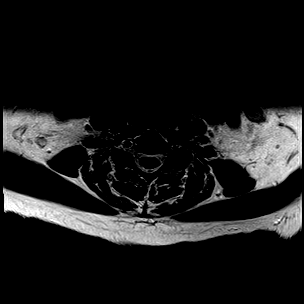
[im 19/30]
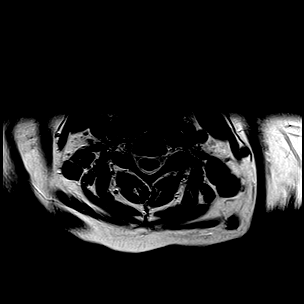
[im 22/30]
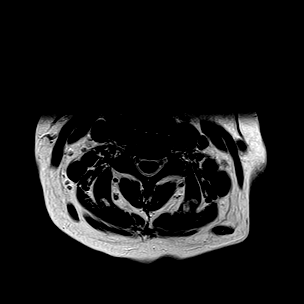
[im 26/30]
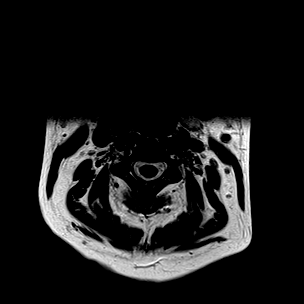
[im 30/30]
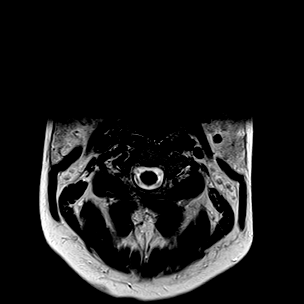

[Series 26: T1 · axial · 3.0mm · 0.39mm/px · z∈[-257,-167]mm · 9 of 30 slices shown (2 of 2)]
[im 1/30]
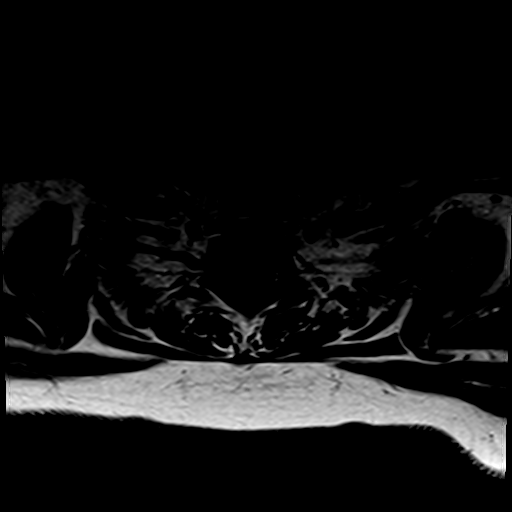
[im 4/30]
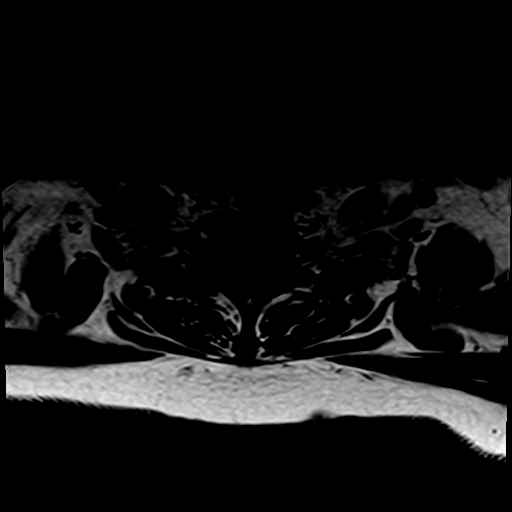
[im 8/30]
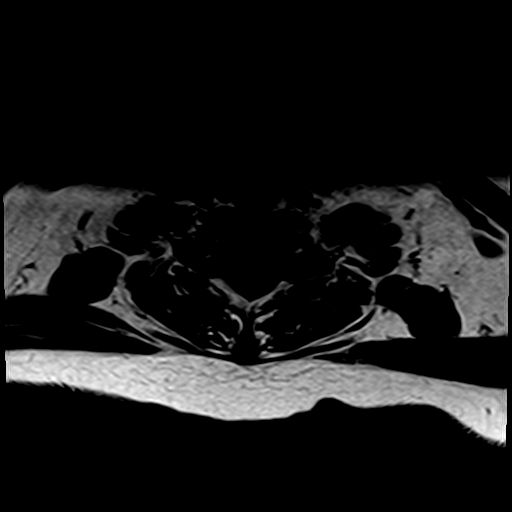
[im 11/30]
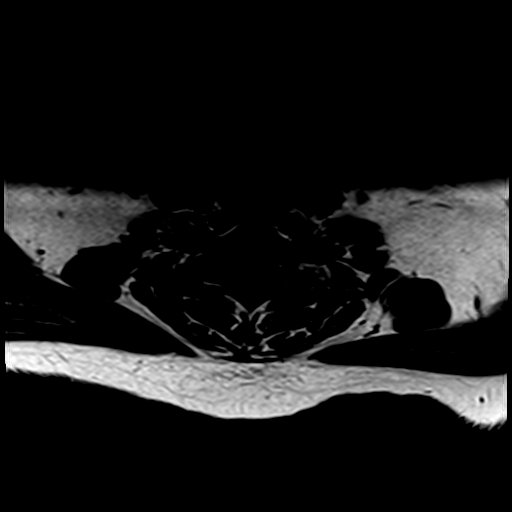
[im 15/30]
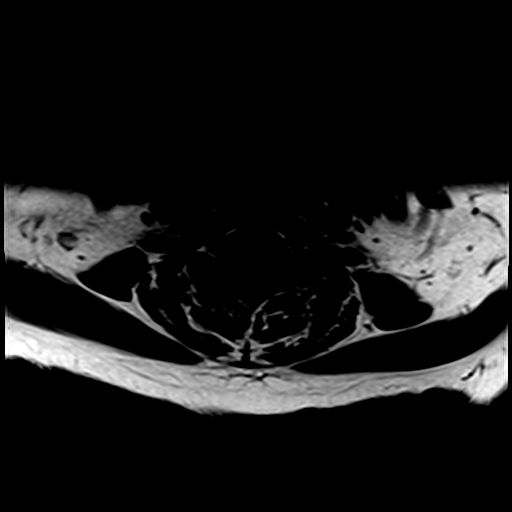
[im 19/30]
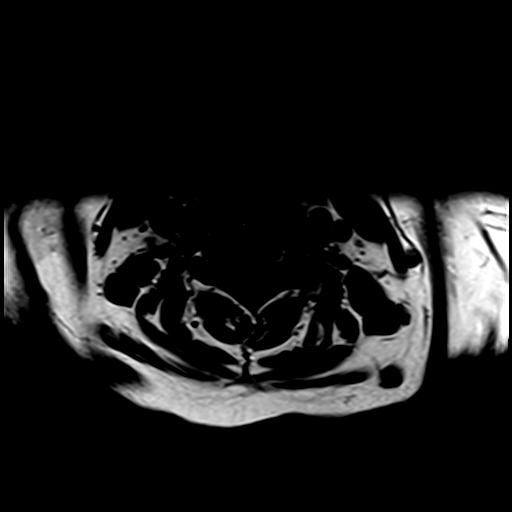
[im 22/30]
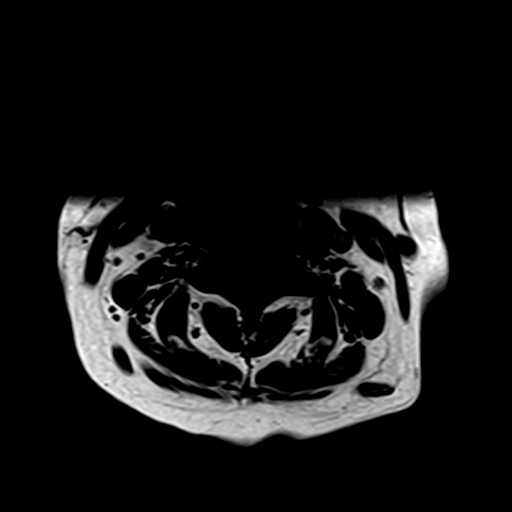
[im 26/30]
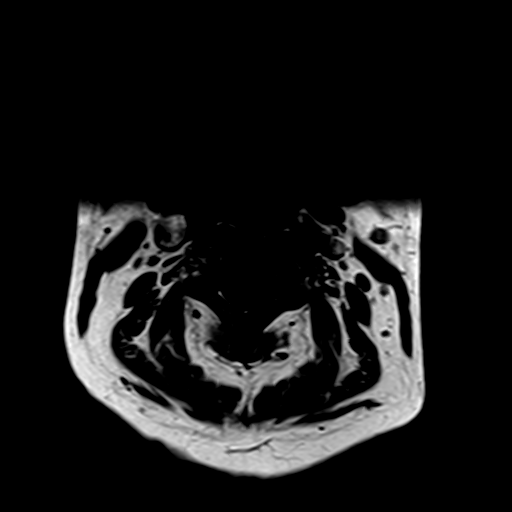
[im 30/30]
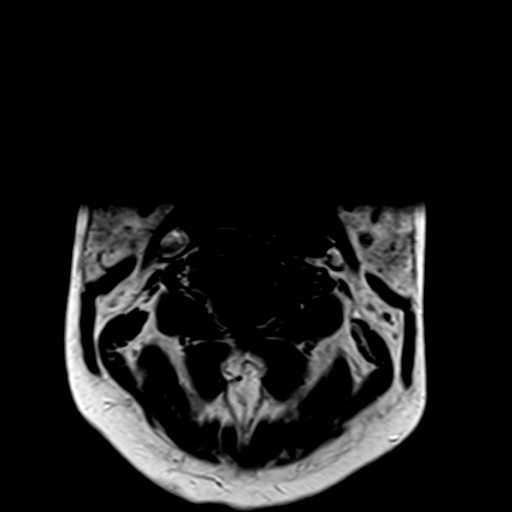

[Series 27: T1 fat-sat post-contrast · sagittal · 3.0mm · 0.43mm/px · 3 of 12 slices shown]
[im 1/12]
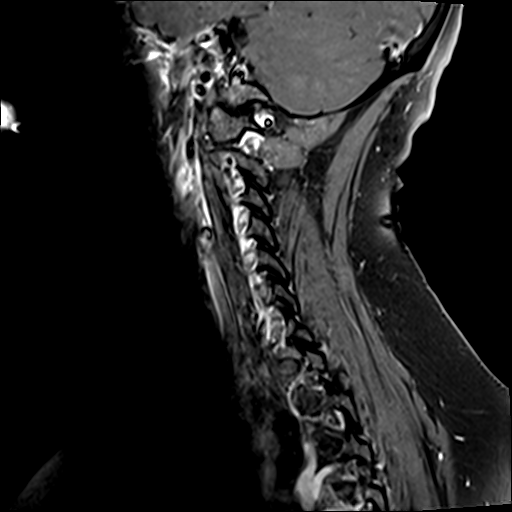
[im 6/12]
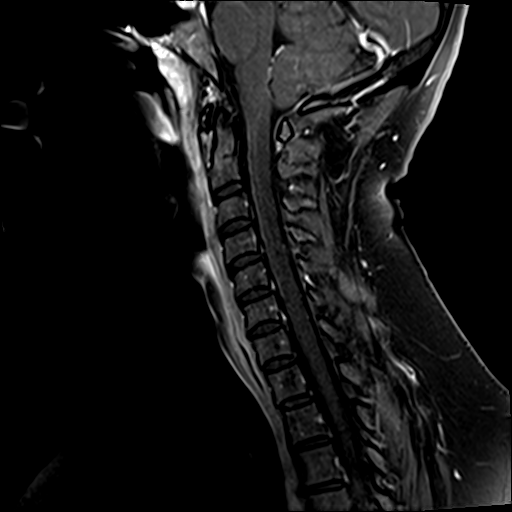
[im 12/12]
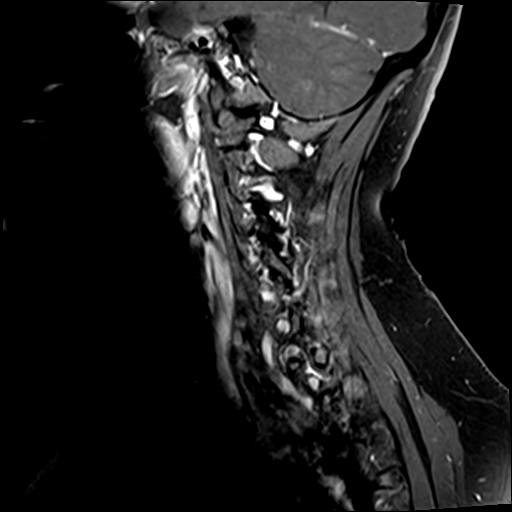

[29 of 48 positions shown; findings below may reference images not displayed]

FINDINGS: MRI HEAD FINDINGS

Brain: No restricted diffusion to suggest acute or subacute
infarction. No acute hemorrhage, mass, mass effect, or midline
shift. No foci of abnormal T2 signal in the periventricular,
juxtacortical, or infratentorial parenchyma. No abnormal
enhancement. No hydrocephalus or extra-axial collection.

Vascular: Normal flow voids.

Skull and upper cervical spine: Normal marrow signal.

Sinuses/Orbits: Hypoplastic right maxillary sinus, with mucosal
thickening. Sinuses are otherwise clear. No acute finding in the
orbits.

Other: Trace fluid in right mastoid air cells.

MRI CERVICAL SPINE FINDINGS

Alignment: Mild straightening of the normal cervical lordosis. No
listhesis.

Vertebrae: No acute fracture or suspicious osseous lesion. No
abnormal enhancement.

Cord: Normal in signal and morphology. No T2 hyperintense foci. No
abnormal enhancement.

Posterior Fossa, vertebral arteries, paraspinal tissues: Negative.

Disc levels:

C2-C3: No significant disc bulge. Mild facet arthropathy. No spinal
canal stenosis or neuroforaminal narrowing.

C3-C4: No significant disc bulge. Mild facet arthropathy. No spinal
canal stenosis or neuroforaminal narrowing.

C4-C5: No significant disc bulge. Facet and uncovertebral
hypertrophy. No spinal canal stenosis. Mild right neural foraminal
narrowing.

C5-C6: No significant disc bulge. Mild facet arthropathy. No spinal
canal stenosis or neural foraminal narrowing.

C6-C7: Mild disc bulge. Mild facet arthropathy. No spinal canal
stenosis or neural foraminal narrowing.

C7-T1: No significant disc bulge. No spinal canal stenosis or
neuroforaminal narrowing.
IMPRESSION: 1. No acute intracranial process.
2. No T2 hyperintense foci in the cerebral or cerebellar parenchyma
or cervical spinal cord to suggest demyelinating disease. No
abnormal enhancement.
3. C4-C5 mild right neural foraminal narrowing. Otherwise mild
degenerative changes, without spinal canal stenosis.

## 2021-01-21 IMAGING — MR MR HEAD WO/W CM
14 of 16 series · 40 of 48 positions shown · IV contrast (gadavist)
Comparison: None.

CLINICAL DATA: Concern for multiple sclerosis.

EXAM:
MRI HEAD WITHOUT AND WITH CONTRAST
MRI CERVICAL SPINE WITHOUT AND WITH CONTRAST
TECHNIQUE: Multiplanar, multiecho pulse sequences of the brain and surrounding
structures, and cervical spine, to include the craniocervical
junction and cervicothoracic junction, were obtained without and
with intravenous contrast.
CONTRAST:  10mL GADAVIST GADOBUTROL 1 MMOL/ML IV SOLN

[Series 5: DWI · axial · 3.0mm · 0.88mm/px · z∈[-89,+50]mm · 6 of 96 slices shown (1 of 4)]
[im 1/96]
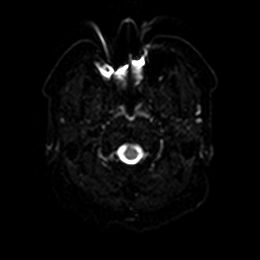
[im 20/96]
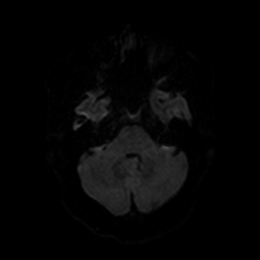
[im 39/96]
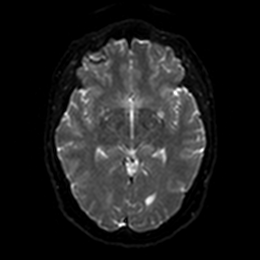
[im 58/96]
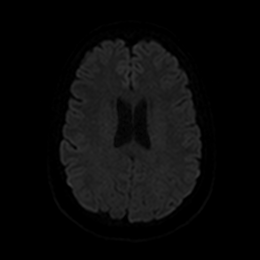
[im 77/96]
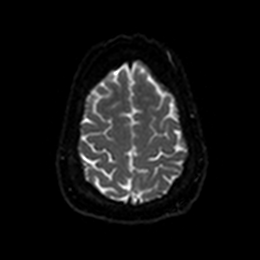
[im 96/96]
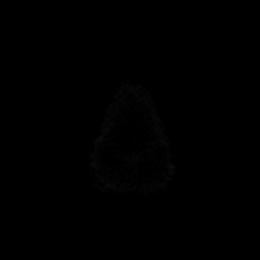

[Series 6: DWI · axial · 3.0mm · 0.88mm/px · z∈[-89,+50]mm · 2 of 48 slices shown (2 of 4)]
[im 1/48]
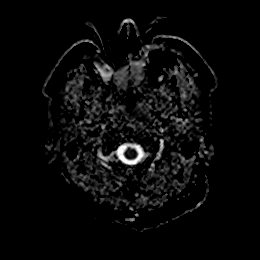
[im 48/48]
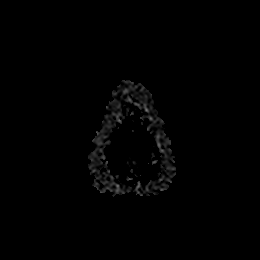

[Series 7: DWI · coronal · 4.0mm · 0.88mm/px · 4 of 68 slices shown (3 of 4)]
[im 1/68]
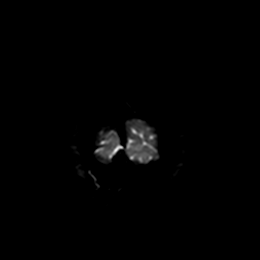
[im 23/68]
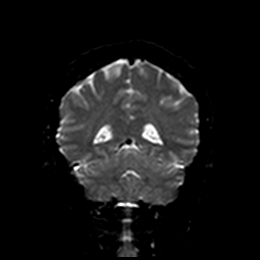
[im 45/68]
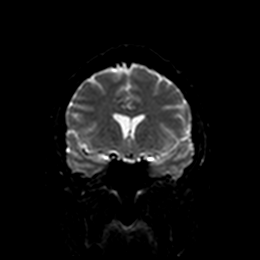
[im 68/68]
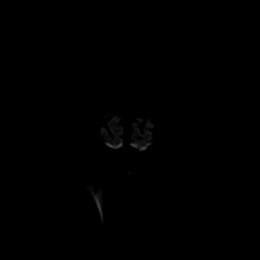

[Series 8: DWI · coronal · 4.0mm · 0.88mm/px · 1 of 34 slices shown (4 of 4)]
[im 1/34]
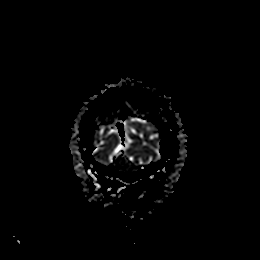

[Series 9: T1 · sagittal · 5.0mm · 0.75mm/px · 2 of 24 slices shown]
[im 1/24]
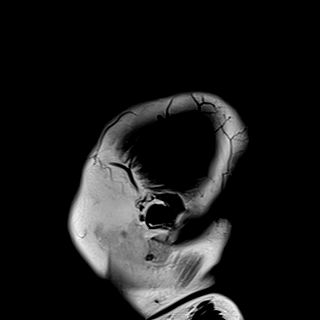
[im 24/24]
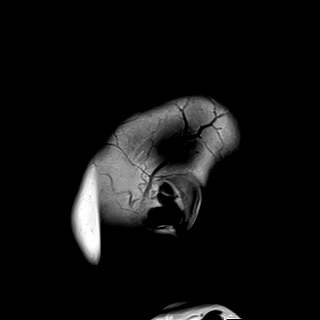

[Series 10: T2 · axial · 5.0mm · 0.72mm/px · z∈[-93,+48]mm · 2 of 25 slices shown]
[im 1/25]
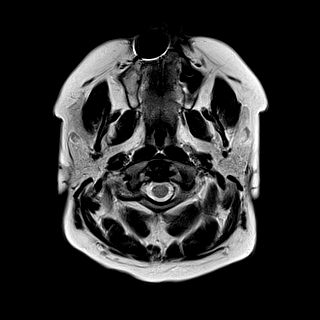
[im 25/25]
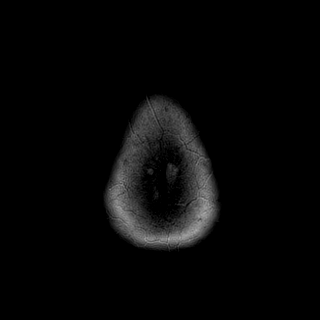

[Series 11: FLAIR · axial · 5.0mm · 0.45mm/px · z∈[-92,+49]mm · 2 of 25 slices shown]
[im 1/25]
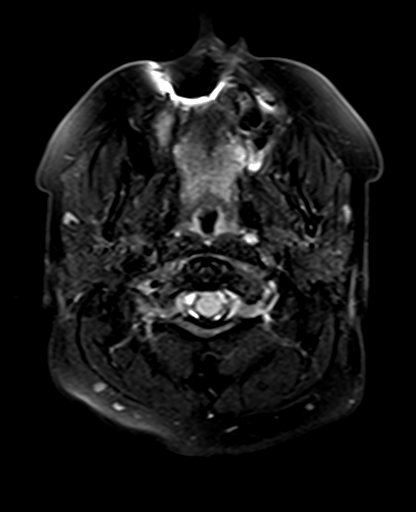
[im 25/25]
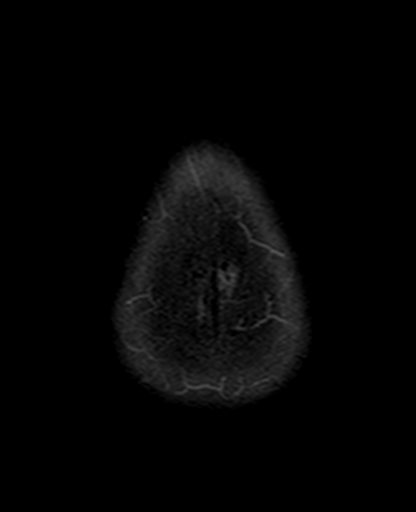

[Series 12: mag_images · axial · 3.0mm · 0.90mm/px · z∈[-100,+50]mm · 4 of 52 slices shown]
[im 1/52]
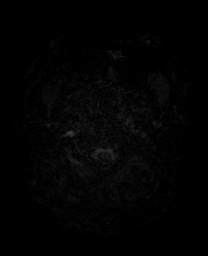
[im 18/52]
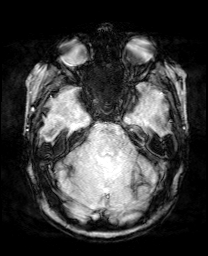
[im 35/52]
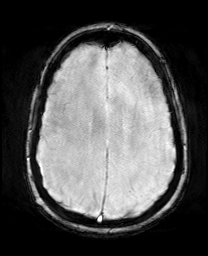
[im 52/52]
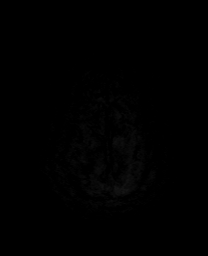

[Series 13: pha_images · axial · 3.0mm · 0.90mm/px · z∈[-100,+50]mm · 4 of 52 slices shown]
[im 1/52]
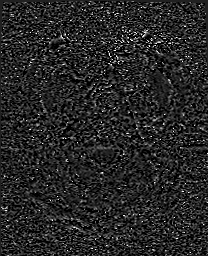
[im 18/52]
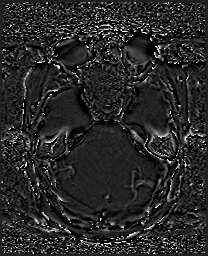
[im 35/52]
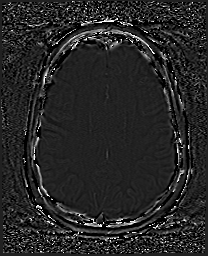
[im 52/52]
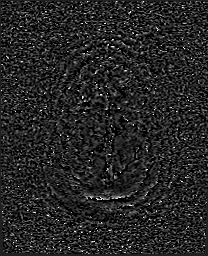

[Series 14: swi_images · axial · 3.0mm · 0.90mm/px · z∈[-100,+50]mm · 4 of 52 slices shown]
[im 1/52]
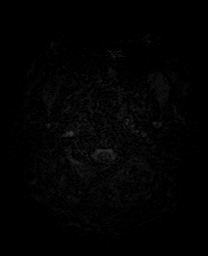
[im 18/52]
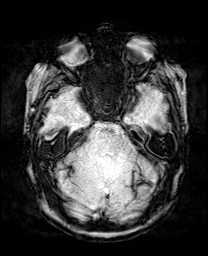
[im 35/52]
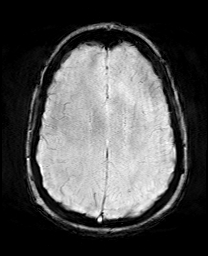
[im 52/52]
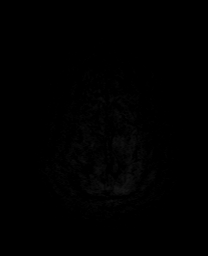

[Series 15: mip_images(sw) · axial · 24.0mm · 0.90mm/px · z∈[-89,+40]mm · 3 of 45 slices shown]
[im 1/45]
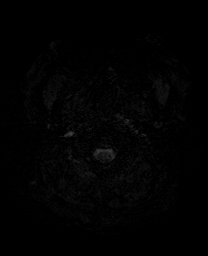
[im 23/45]
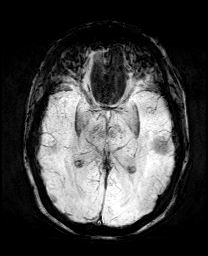
[im 45/45]
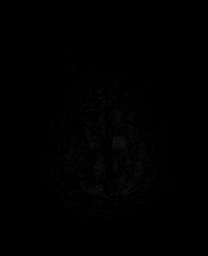

[Series 29: T2 post-contrast · coronal · 5.0mm · 0.72mm/px · 2 of 28 slices shown]
[im 1/28]
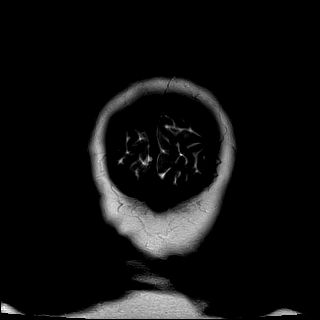
[im 28/28]
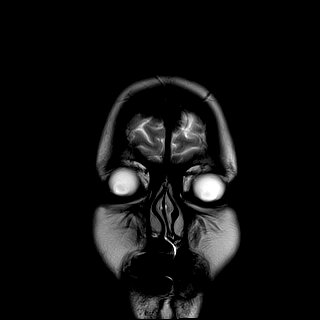

[Series 31: T1 post-contrast · coronal · 5.0mm · 0.34mm/px · 2 of 28 slices shown (1 of 2)]
[im 1/28]
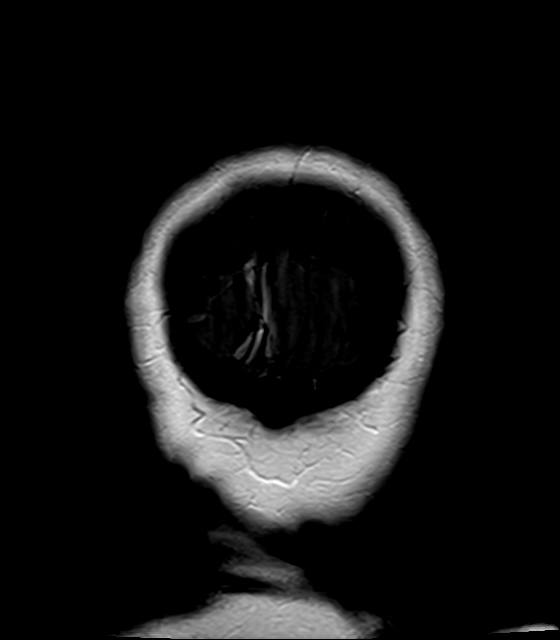
[im 28/28]
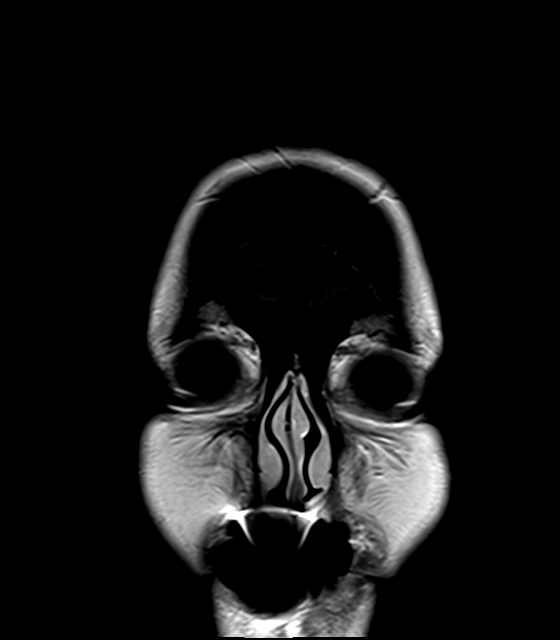

[Series 32: T1 post-contrast · sagittal · 5.0mm · 0.72mm/px · 2 of 24 slices shown (2 of 2)]
[im 1/24]
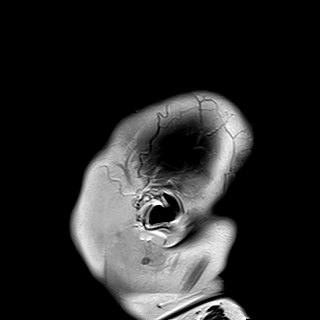
[im 24/24]
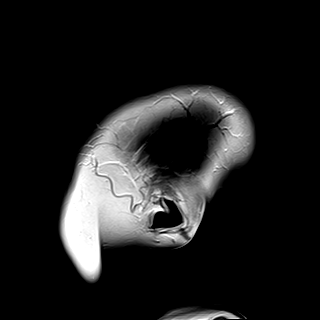

[40 of 48 positions shown; findings below may reference images not displayed]

FINDINGS: MRI HEAD FINDINGS

Brain: No restricted diffusion to suggest acute or subacute
infarction. No acute hemorrhage, mass, mass effect, or midline
shift. No foci of abnormal T2 signal in the periventricular,
juxtacortical, or infratentorial parenchyma. No abnormal
enhancement. No hydrocephalus or extra-axial collection.

Vascular: Normal flow voids.

Skull and upper cervical spine: Normal marrow signal.

Sinuses/Orbits: Hypoplastic right maxillary sinus, with mucosal
thickening. Sinuses are otherwise clear. No acute finding in the
orbits.

Other: Trace fluid in right mastoid air cells.

MRI CERVICAL SPINE FINDINGS

Alignment: Mild straightening of the normal cervical lordosis. No
listhesis.

Vertebrae: No acute fracture or suspicious osseous lesion. No
abnormal enhancement.

Cord: Normal in signal and morphology. No T2 hyperintense foci. No
abnormal enhancement.

Posterior Fossa, vertebral arteries, paraspinal tissues: Negative.

Disc levels:

C2-C3: No significant disc bulge. Mild facet arthropathy. No spinal
canal stenosis or neuroforaminal narrowing.

C3-C4: No significant disc bulge. Mild facet arthropathy. No spinal
canal stenosis or neuroforaminal narrowing.

C4-C5: No significant disc bulge. Facet and uncovertebral
hypertrophy. No spinal canal stenosis. Mild right neural foraminal
narrowing.

C5-C6: No significant disc bulge. Mild facet arthropathy. No spinal
canal stenosis or neural foraminal narrowing.

C6-C7: Mild disc bulge. Mild facet arthropathy. No spinal canal
stenosis or neural foraminal narrowing.

C7-T1: No significant disc bulge. No spinal canal stenosis or
neuroforaminal narrowing.
IMPRESSION: 1. No acute intracranial process.
2. No T2 hyperintense foci in the cerebral or cerebellar parenchyma
or cervical spinal cord to suggest demyelinating disease. No
abnormal enhancement.
3. C4-C5 mild right neural foraminal narrowing. Otherwise mild
degenerative changes, without spinal canal stenosis.

## 2021-01-21 MED ORDER — GADOBUTROL 1 MMOL/ML IV SOLN
10.0000 mL | Freq: Once | INTRAVENOUS | Status: AC | PRN
  Administered 2021-01-21: 10 mL via INTRAVENOUS

## 2021-02-01 ENCOUNTER — Other Ambulatory Visit: Payer: Worker's Compensation

## 2021-02-08 ENCOUNTER — Telehealth: Payer: Self-pay | Admitting: Family

## 2021-02-08 NOTE — Telephone Encounter (Signed)
Pt feels like she has a head cold. Tested positive for COVID with at home test and wanted to let her PCP know. She does not want any medications at this time.

## 2021-02-09 NOTE — Telephone Encounter (Signed)
Patient aware and verbalized understanding. °

## 2021-02-09 NOTE — Telephone Encounter (Signed)
Ok this is fine. If symptoms worsen let us know and we will send in antiviral medication. You have to start this within 5 days of symptoms.

## 2021-02-10 ENCOUNTER — Telehealth: Payer: Self-pay | Admitting: Family

## 2021-02-13 NOTE — Telephone Encounter (Signed)
Pt is out of window for treatment at this time. How is she feeling?

## 2021-02-13 NOTE — Telephone Encounter (Addendum)
Pt is aware and states she is feeling a little better today and doesn't need anything at the moment.

## 2021-04-10 ENCOUNTER — Other Ambulatory Visit (HOSPITAL_COMMUNITY): Payer: Self-pay | Admitting: Obstetrics & Gynecology

## 2021-04-10 DIAGNOSIS — Z1231 Encounter for screening mammogram for malignant neoplasm of breast: Secondary | ICD-10-CM

## 2021-04-21 ENCOUNTER — Other Ambulatory Visit: Payer: Self-pay

## 2021-04-21 ENCOUNTER — Ambulatory Visit (HOSPITAL_COMMUNITY)
Admission: RE | Admit: 2021-04-21 | Discharge: 2021-04-21 | Disposition: A | Discharge status: Home / Self Care | Payer: No Typology Code available for payment source | Source: Ambulatory Visit | Attending: Obstetrics & Gynecology | Admitting: Obstetrics & Gynecology

## 2021-04-21 ENCOUNTER — Other Ambulatory Visit (HOSPITAL_COMMUNITY)
Admission: RE | Admit: 2021-04-21 | Discharge: 2021-04-21 | Disposition: A | Discharge status: Home / Self Care | Payer: No Typology Code available for payment source | Source: Ambulatory Visit | Attending: Obstetrics & Gynecology | Admitting: Obstetrics & Gynecology

## 2021-04-21 ENCOUNTER — Encounter: Payer: Self-pay | Admitting: Obstetrics & Gynecology

## 2021-04-21 ENCOUNTER — Ambulatory Visit (INDEPENDENT_AMBULATORY_CARE_PROVIDER_SITE_OTHER): Payer: No Typology Code available for payment source | Admitting: Obstetrics & Gynecology

## 2021-04-21 VITALS — BP 138/88 | HR 86 | Ht 63.0 in | Wt 228.0 lb

## 2021-04-21 DIAGNOSIS — Z1231 Encounter for screening mammogram for malignant neoplasm of breast: Secondary | ICD-10-CM | POA: Diagnosis present

## 2021-04-21 DIAGNOSIS — Z01419 Encounter for gynecological examination (general) (routine) without abnormal findings: Secondary | ICD-10-CM | POA: Insufficient documentation

## 2021-04-21 IMAGING — MG MM DIGITAL SCREENING BILAT W/ TOMO AND CAD
8 series · 8 of 24 positions shown · non-contrast
Comparison: Previous exam(s).

CLINICAL DATA: Screening.

EXAM:
DIGITAL SCREENING BILATERAL MAMMOGRAM WITH TOMOSYNTHESIS AND CAD
TECHNIQUE: Bilateral screening digital craniocaudal and mediolateral oblique
mammograms were obtained. Bilateral screening digital breast
tomosynthesis was performed. The images were evaluated with
computer-aided detection.

[R CC synth-2D]
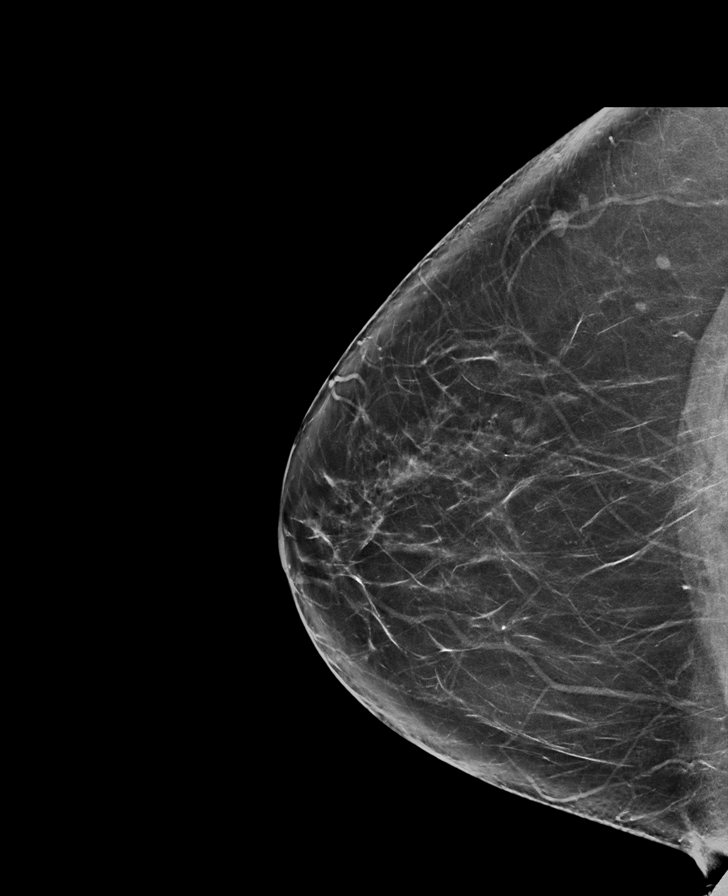

[L MLO synth-2D]
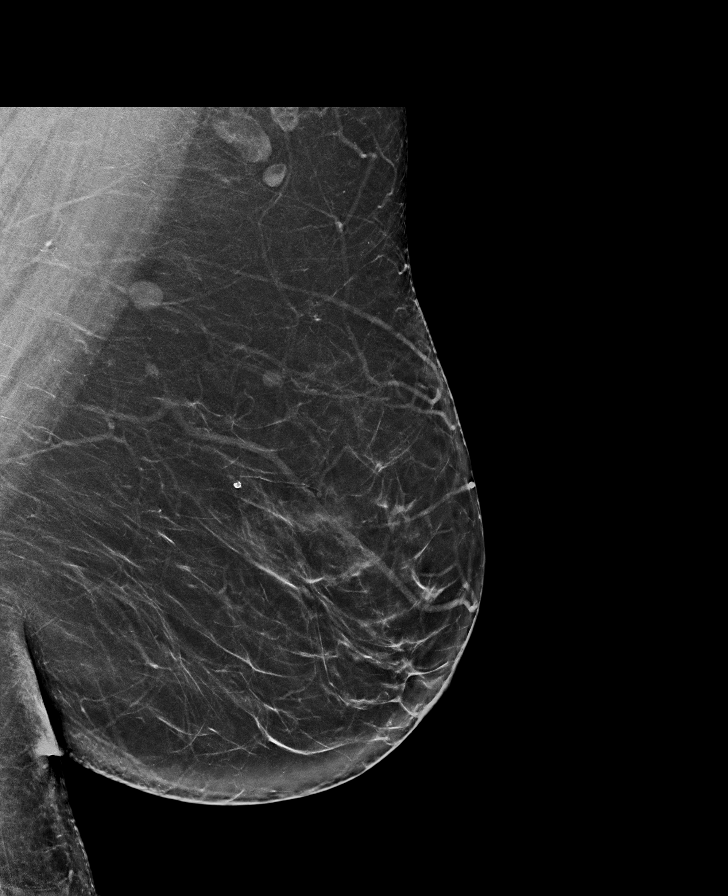

[R MLO synth-2D]
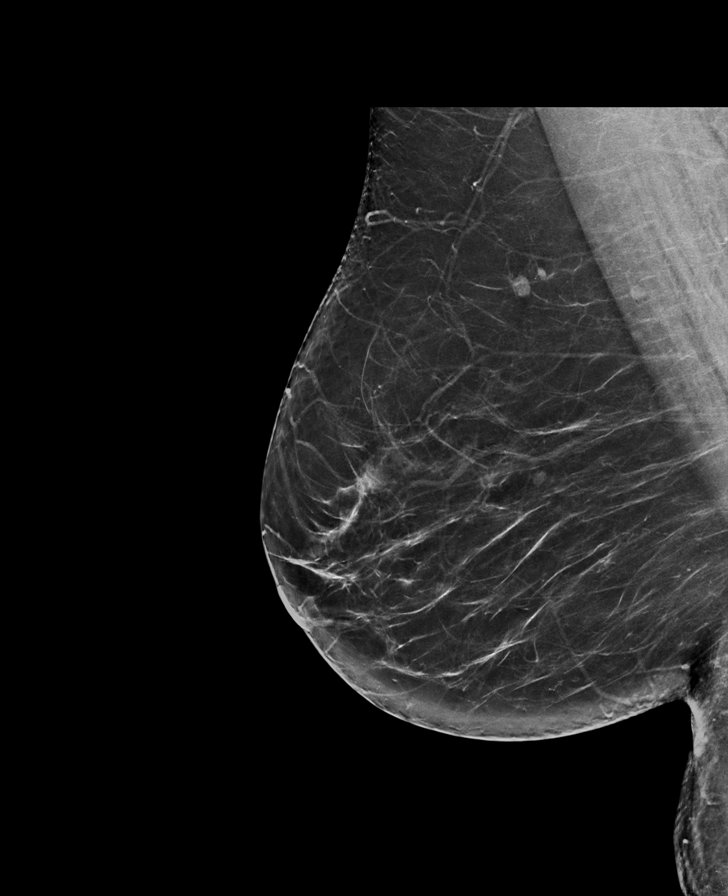

[L CC synth-2D]
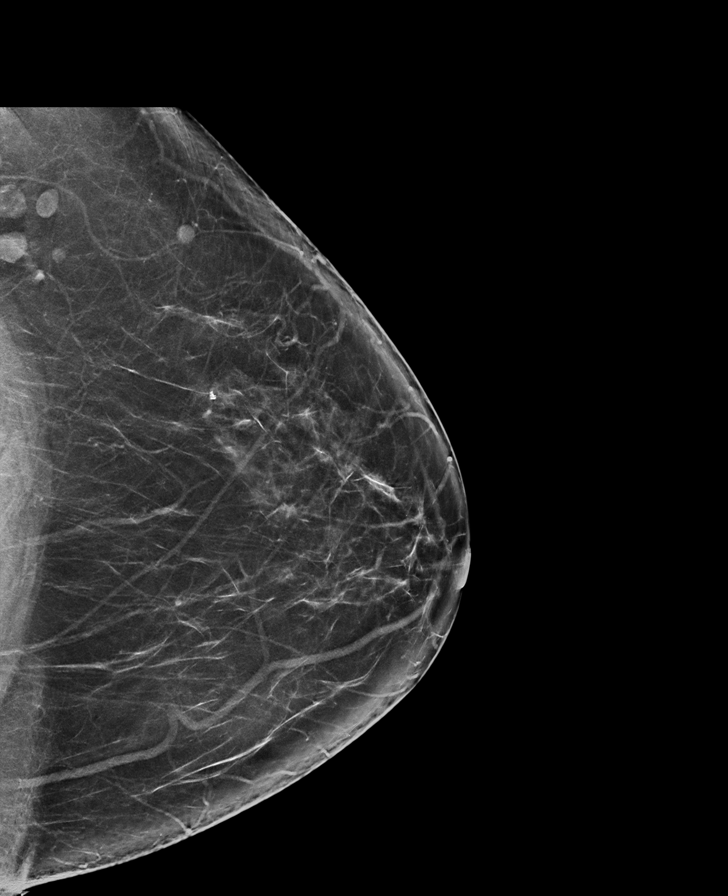

[L MLO tomo · tomo slice 43/86.0]
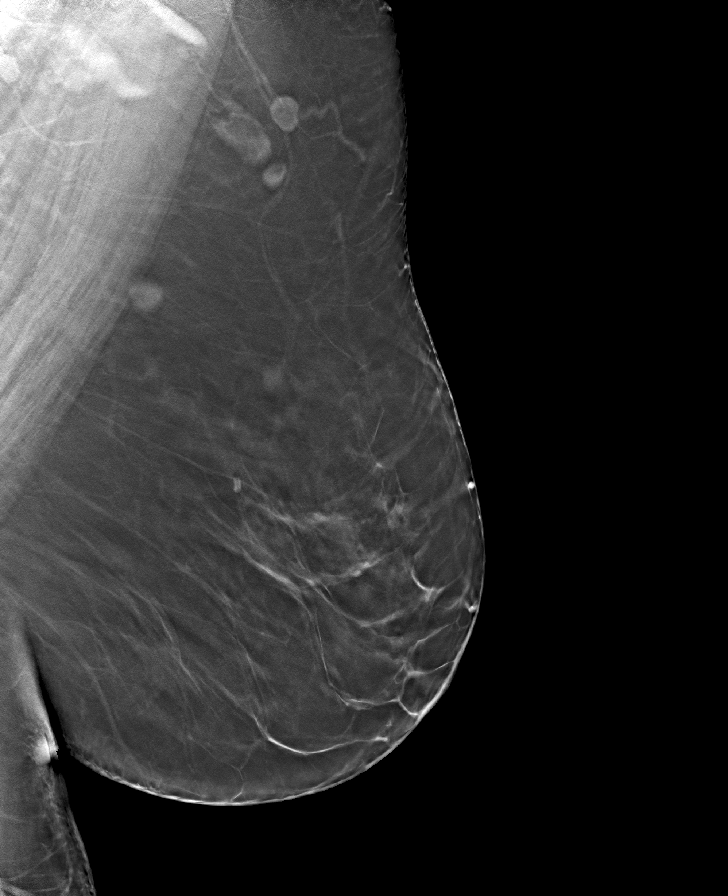

[L CC tomo · tomo slice 41/81.0]
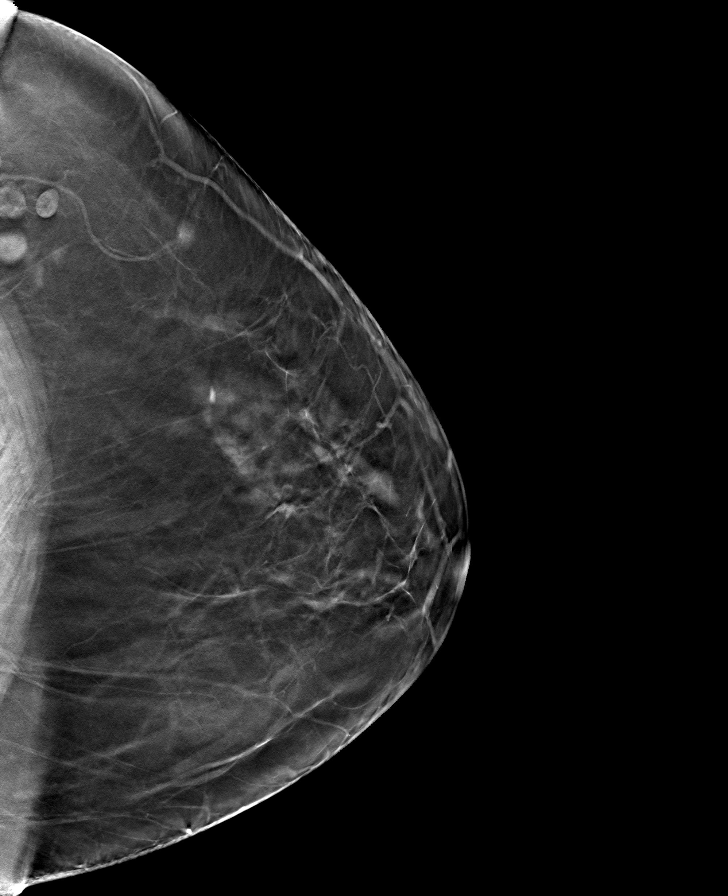

[R CC tomo · tomo slice 40/79.0]
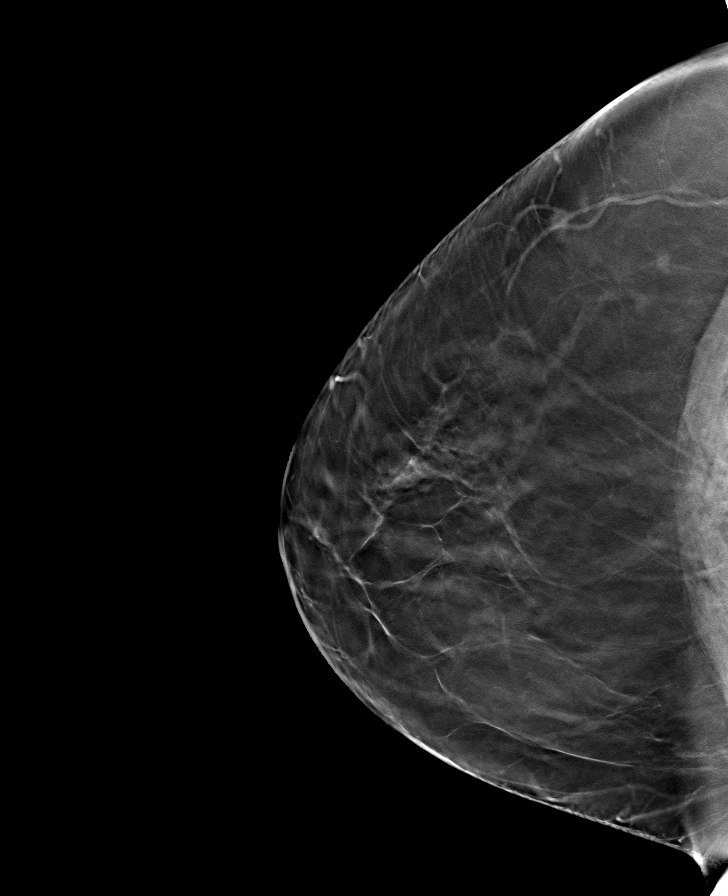

[R MLO tomo · tomo slice 41/82.0]
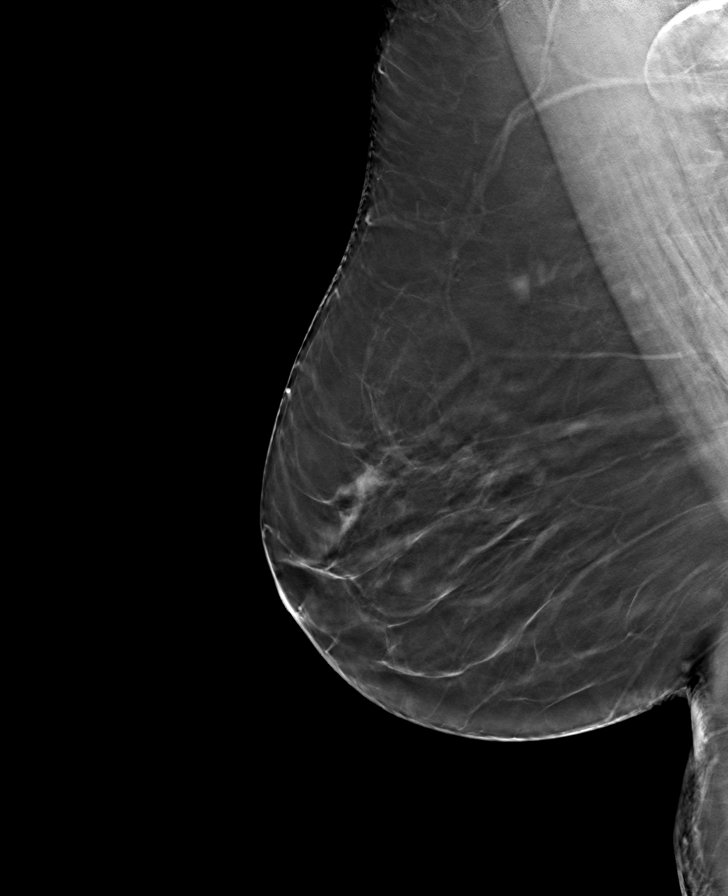

[8 of 24 positions shown; findings below may reference images not displayed]

ACR Breast Density Category b: There are scattered areas of
fibroglandular density.
FINDINGS: There are no findings suspicious for malignancy.
IMPRESSION: No mammographic evidence of malignancy. A result letter of this
screening mammogram will be mailed directly to the patient.

RECOMMENDATION:
Screening mammogram in one year. (Code:[BY])

BI-RADS CATEGORY  1: Negative.

## 2021-04-21 NOTE — Progress Notes (Signed)
Subjective:  ?  ? Kim Reed is a 56 y.o. female here for a routine exam.  No LMP recorded. Patient is perimenopausal. [id number] ?Birth Control Method:  menopause ?Menstrual Calendar(currently): minimal over past year  ?Current complaints: none.  ? ?Current acute medical issues:  none ?  ?Recent Gynecologic History ?No LMP recorded. Patient is perimenopausal. ?Last Pap: 2021,  normal ?Last mammogram: today,   ? ?Past Medical History:  ?Diagnosis Date  ? Endometriosis   ? Ovarian cyst   ? Plantar fasciitis, bilateral   ? ? ?Past Surgical History:  ?Procedure Laterality Date  ? COLONOSCOPY  02/02/2019  ? COLONOSCOPY N/A 02/02/2019  ? Procedure: COLONOSCOPY;  Surgeon: Danie Binder, MD;  Location: [facility];  Service: Endoscopy;  Laterality: N/A;  1:30  ? EYE SURGERY    ? POLYPECTOMY  02/02/2019  ? Procedure: POLYPECTOMY;  Surgeon: Danie Binder, MD;  Location: [facility];  Service: Endoscopy;;  descending colon  ? TUBAL LIGATION    ? ? ?OB History   ? ? Gravida  ?2  ? Para  ?2  ? Term  ?2  ? Preterm  ?   ? AB  ?   ? Living  ?2  ?  ? ? SAB  ?   ? IAB  ?   ? Ectopic  ?   ? Multiple  ?   ? Live Births  ?2  ?   ?  ?  ? ? ?Social History  ? ?Socioeconomic History  ? Marital status: Divorced  ?  Spouse name: Not on file  ? Number of children: Not on file  ? Years of education: Not on file  ? Highest education level: Not on file  ?Occupational History  ?  Employer: [employer]  ?Tobacco Use  ? Smoking status: Former  ?  Types: Cigarettes  ?  Quit date: 02/18/2008  ?  Years since quitting: 13.1  ? Smokeless tobacco: Never  ?Vaping Use  ? Vaping Use: Never used  ?Substance and Sexual Activity  ? Alcohol use: Yes  ?  Alcohol/week: 0.0 standard drinks  ?  Comment: socially  ? Drug use: No  ? Sexual activity: Yes  ?  Birth control/protection: Surgical  ?  Comment: tubal  ?Other Topics Concern  ? Not on file  ?Social History Narrative  ? Not on file  ? ?Social Determinants of Health   ? ?Financial Resource Strain: Low Risk   ? Difficulty of Paying Living Expenses: Not hard at all  ?Food Insecurity: No Food Insecurity  ? Worried About Charity fundraiser in the Last Year: Never true  ? Ran Out of Food in the Last Year: Never true  ?Transportation Needs: No Transportation Needs  ? Lack of Transportation (Medical): No  ? Lack of Transportation (Non-Medical): No  ?Physical Activity: Sufficiently Active  ? Days of Exercise per Week: 6 days  ? Minutes of Exercise per Session: 30 min  ?Stress: No Stress Concern Present  ? Feeling of Stress : Not at all  ?Social Connections: Moderately Isolated  ? Frequency of Communication with Friends and Family: More than three times a week  ? Frequency of Social Gatherings with Friends and Family: Once a week  ? Attends Religious Services: Never  ? Active Member of Clubs or Organizations: No  ? Attends Archivist Meetings: Never  ? Marital Status: Married  ? ? ?Family History  ?Problem Relation Age of Onset  ?  Thyroid disease Mother   ? Colon polyps Mother   ?     AGE > 60  ? Cancer Father   ?     prostate  ? Thyroid disease Father   ? Colon polyps Father   ?     AGE > 60  ? Asthma Sister   ? Other Sister   ?     had heavy periods  ? GER disease Brother   ? Cancer Paternal Grandmother   ? Alzheimer's disease Maternal Grandmother   ? Heart attack Maternal Grandfather   ? Breast cancer Other   ? Colon cancer Other   ? Colon cancer Paternal Uncle   ?     AGE < 51  ? ? ? ?Current Outpatient Medications:  ?  Multiple Vitamin (MULTI VITAMIN PO), Take by mouth daily. , Disp: , Rfl:  ?  vitamin B-12 (CYANOCOBALAMIN) 100 MCG tablet, Take 100 mcg by mouth daily., Disp: , Rfl:  ?  Vitamin D, Cholecalciferol, 10 MCG (400 UNIT) TABS, , Disp: , Rfl:  ? ?Review of Systems ? ?Review of Systems  ?Constitutional: Negative for fever, chills, weight loss, malaise/fatigue and diaphoresis.  ?HENT: Negative for hearing loss, ear pain, nosebleeds, congestion, sore throat, neck  pain, tinnitus and ear discharge.   ?Eyes: Negative for blurred vision, double vision, photophobia, pain, discharge and redness.  ?Respiratory: Negative for cough, hemoptysis, sputum production, shortness of breath, wheezing and stridor.   ?Cardiovascular: Negative for chest pain, palpitations, orthopnea, claudication, leg swelling and PND.  ?Gastrointestinal: negative for abdominal pain. Negative for heartburn, nausea, vomiting, diarrhea, constipation, blood in stool and melena.  ?Genitourinary: Negative for dysuria, urgency, frequency, hematuria and flank pain.  ?Musculoskeletal: Negative for myalgias, back pain, joint pain and falls.  ?Skin: Negative for itching and rash.  ?Neurological: Negative for dizziness, tingling, tremors, sensory change, speech change, focal weakness, seizures, loss of consciousness, weakness and headaches.  ?Endo/Heme/Allergies: Negative for environmental allergies and polydipsia. Does not bruise/bleed easily.  ?Psychiatric/Behavioral: Negative for depression, suicidal ideas, hallucinations, memory loss and substance abuse. The patient is not nervous/anxious and does not have insomnia.   ? ?  ?  ?Objective:  ?Blood pressure 138/88, pulse 86, height '5\' 3"'$  (1.6 m), weight 228 lb (103.4 kg).  ? Physical Exam  ?Vitals reviewed. ?Constitutional: She is oriented to person, place, and time. She appears well-developed and well-nourished.  ?HENT:  ?Head: Normocephalic and atraumatic.        ?Right Ear: External ear normal.  ?Left Ear: External ear normal.  ?Nose: Nose normal.  ?Mouth/Throat: Oropharynx is clear and moist.  ?Eyes: Conjunctivae and EOM are normal. Pupils are equal, round, and reactive to light. Right eye exhibits no discharge. Left eye exhibits no discharge. No scleral icterus.  ?Neck: Normal range of motion. Neck supple. No tracheal deviation present. No thyromegaly present.  ?Cardiovascular: Normal rate, regular rhythm, normal heart sounds and intact distal pulses.  Exam reveals  no gallop and no friction rub.   ?No murmur heard. ?Respiratory: Effort normal and breath sounds normal. No respiratory distress. She has no wheezes. She has no rales. She exhibits no tenderness.  ?GI: Soft. Bowel sounds are normal. She exhibits no distension and no mass. There is no tenderness. There is no rebound and no guarding.  ?Genitourinary:  ?Breasts no masses skin changes or nipple changes bilaterally ?     Vulva is normal without lesions ?Vagina is pink moist without discharge ?Cervix normal in appearance and pap is done ?Uterus  is normal size shape and contour ?Adnexa is negative with normal sized ovaries  ? ?Musculoskeletal: Normal range of motion. She exhibits no edema and no tenderness.  ?Neurological: She is alert and oriented to person, place, and time. She has normal reflexes. She displays normal reflexes. No cranial nerve deficit. She exhibits normal muscle tone. Coordination normal.  ?Skin: Skin is warm and dry. No rash noted. No erythema. No pallor.  ?Psychiatric: She has a normal mood and affect. Her behavior is normal. Judgment and thought content normal.  ? ?   ? ?Medications Ordered at today's visit: ?No orders of the defined types were placed in this encounter. ? ? ?Other orders placed at today's visit: ?No orders of the defined types were placed in this encounter. ? ? ? ? ?Assessment:  ? ? Normal Gyn exam.  ? Menopause ?Plan:  ? ? Follow up in: 3 years.   ? ? ?Return in about 3 years (around 04/21/2024) for yearly. ? ?

## 2021-04-24 LAB — CYTOLOGY - PAP
Comment: NEGATIVE
Diagnosis: NEGATIVE
High risk HPV: NEGATIVE

## 2021-06-05 ENCOUNTER — Ambulatory Visit (INDEPENDENT_AMBULATORY_CARE_PROVIDER_SITE_OTHER): Payer: No Typology Code available for payment source | Admitting: Family

## 2021-06-05 ENCOUNTER — Encounter: Payer: Self-pay | Admitting: Family

## 2021-06-05 VITALS — BP 107/61 | HR 73 | Temp 98.1°F | Ht 63.0 in | Wt 225.4 lb

## 2021-06-05 DIAGNOSIS — Z0001 Encounter for general adult medical examination with abnormal findings: Secondary | ICD-10-CM | POA: Diagnosis not present

## 2021-06-05 DIAGNOSIS — Z23 Encounter for immunization: Secondary | ICD-10-CM

## 2021-06-05 DIAGNOSIS — E559 Vitamin D deficiency, unspecified: Secondary | ICD-10-CM

## 2021-06-05 DIAGNOSIS — R5383 Other fatigue: Secondary | ICD-10-CM

## 2021-06-05 DIAGNOSIS — W57XXXA Bitten or stung by nonvenomous insect and other nonvenomous arthropods, initial encounter: Secondary | ICD-10-CM | POA: Diagnosis not present

## 2021-06-05 DIAGNOSIS — S80861A Insect bite (nonvenomous), right lower leg, initial encounter: Secondary | ICD-10-CM

## 2021-06-05 DIAGNOSIS — B070 Plantar wart: Secondary | ICD-10-CM | POA: Diagnosis not present

## 2021-06-05 DIAGNOSIS — E538 Deficiency of other specified B group vitamins: Secondary | ICD-10-CM | POA: Diagnosis not present

## 2021-06-05 DIAGNOSIS — Z0000 Encounter for general adult medical examination without abnormal findings: Secondary | ICD-10-CM

## 2021-06-05 NOTE — Patient Instructions (Signed)
Plantar Warts ?Warts are small growths on the skin. When they occur on the underside (sole) of the foot, they are called plantar warts. Plantar warts often occur in groups, with several small warts around a larger wart. They tend to develop on the heel or the ball of the foot. They may grow into the deeper layers of skin or rise above the surface of the skin. ?Most warts are not painful, and they usually do not cause problems. However, plantar warts may cause pain when you walk because pressure is applied to them. Plantar warts may spread to other areas of the sole. They can also spread to other areas of the body through direct and indirect contact. Warts often go away on their own in time. Various treatments may be done if needed or desired. ?What are the causes? ?Plantar warts are caused by a type of virus that is called human papillomavirus (HPV). ?Walking barefoot can cause exposure to the virus, especially if your feet are wet. ?HPV attacks a break in the skin of the foot. ?What increases the risk? ?You are more likely to develop this condition if you: ?Are between 47 and 85 years of age. ?Use public showers or locker rooms. ?Have a weakened body defense system (immune system). ?What are the signs or symptoms? ?Common symptoms of this condition include: ?Flat or slightly raised growths that have a rough surface and look similar to a callus. ?Pain when you use your foot to support your body weight. ?How is this diagnosed? ?A plantar wart can usually be diagnosed from its appearance. In some cases, a tissue sample may be removed (biopsy) to be looked at under a microscope. ?How is this treated? ?In many cases, warts do not need treatment. Without treatment, they often go away with time. If treatment is needed or desired, options may include: ?Applying medicated solutions, creams, or patches to the wart. These may be over-the-counter or prescription medicines that make the skin soft so that layers will gradually  shed away. In many cases, the medicine is applied one or two times a day and covered with a bandage. ?Freezing the wart with liquid nitrogen (cryotherapy). ?Burning the wart with: ?Laser treatment. ?An electrified probe (electrocautery). ?Injecting a medicine (Candida antigen) into the wart to help the body's immune system fight off the wart. ?Having surgery to remove the wart. ?Putting duct tape over the top of the wart (occlusion). You will leave the tape in place for as long as told by your health care provider, and then you will replace it with a new strip of tape. This is done until the wart goes away. ?Repeat treatment may be needed if you choose to remove warts. Warts sometimes go away and come back again. ?Follow these instructions at home: ?Apply medicated creams or solutions only as told by your health care provider. This may involve: ?Soaking the affected area in warm water. ?Removing the top layer of softened skin before you apply the medicine. A pumice stone works well for removing the skin. ?Applying a bandage over the affected area after you apply the medicine. ?Repeating the process daily or as told by your health care provider. ?Do not scratch or pick at a wart. ?Wash your hands after you touch a wart. ?If a wart is painful, try covering it with a bandage that has a hole in the middle. This helps to take pressure off the wart. ?Keep all follow-up visits as told by your health care provider. This is important. ?How  is this prevented? ?Take these actions to help prevent warts: ?Wear shoes and socks. Change your socks daily. ?Keep your feet clean and dry. ?Do not walk barefoot in shared locker rooms, shower areas, or swimming pools. ?Check your feet regularly. ?Avoid direct contact with warts on other people. ?Contact a health care provider if: ?Your warts do not improve after treatment. ?You have redness, swelling, or pain at the site of a wart. ?You have bleeding from a wart that does not stop with  light pressure. ?You have diabetes and you develop a wart. ?Summary ?Warts are small growths on the skin. When they occur on the underside (sole) of the foot, they are called plantar warts. ?In many cases, warts do not need treatment. Without treatment, they often go away with time. ?Apply medicated creams or solutions only as told by your health care provider. ?Do not scratch or pick at a wart. Wash your hands after you touch a wart. ?Keep all follow-up visits as told by your health care provider. This is important. ?This information is not intended to replace advice given to you by your health care provider. Make sure you discuss any questions you have with your health care provider. ?Document Revised: 05/12/2020 Document Reviewed: 05/12/2020 ?Elsevier Patient Education ? [place]. ? ?

## 2021-06-05 NOTE — Progress Notes (Signed)
? ?Subjective:  ? ? Patient ID: Kim Reed, female    DOB: 1965-09-14, 56 y.o.   MRN: [id number] ? ?Chief Complaint  ?Patient presents with  ? Annual Exam  ? ? ?HPI ?PT presents to the office today CPE without pap. She is followed by GYN annually and her last pap was 04/21/21 that was normal. ? ?She reports her daughter was recently diagnosed with MS. The patient was having left facial numbness, and bilateral numbness. She had MRI brain that was negative. Found to have Vit B 12 deficiency and was started on injection. She has a follow up with neurologists.   ? ?She has a skin lesion on her back and left foot that has been there several months.  ? ?She reports she had a tick bite on right lower leg that was over 6 months ago. She has continued to have fatigue. Her husband just tested positive for Alpha Gal and wants to be tested for this.  ? ?Review of Systems  ?All other systems reviewed and are negative. ? ?   ?Objective:  ? Physical Exam ?Vitals reviewed.  ?Constitutional:   ?   General: She is not in acute distress. ?   Appearance: She is well-developed. She is obese.  ?HENT:  ?   Head: Normocephalic and atraumatic.  ?   Right Ear: Tympanic membrane normal.  ?   Left Ear: Tympanic membrane normal.  ?Eyes:  ?   Pupils: Pupils are equal, round, and reactive to light.  ?Neck:  ?   Thyroid: No thyromegaly.  ?Cardiovascular:  ?   Rate and Rhythm: Normal rate and regular rhythm.  ?   Heart sounds: Normal heart sounds. No murmur heard. ?Pulmonary:  ?   Effort: Pulmonary effort is normal. No respiratory distress.  ?   Breath sounds: Normal breath sounds. No wheezing.  ?Abdominal:  ?   General: Bowel sounds are normal. There is no distension.  ?   Palpations: Abdomen is soft.  ?   Tenderness: There is no abdominal tenderness.  ?Musculoskeletal:     ?   General: No tenderness. Normal range of motion.  ?   Cervical back: Normal range of motion and neck supple.  ?     Feet: ? ?Skin: ?   General: Skin is warm and dry.   ?Neurological:  ?   Mental Status: She is alert and oriented to person, place, and time.  ?   Cranial Nerves: No cranial nerve deficit.  ?   Deep Tendon Reflexes: Reflexes are normal and symmetric.  ?Psychiatric:     ?   Behavior: Behavior normal.     ?   Thought Content: Thought content normal.     ?   Judgment: Judgment normal.  ? ?Cryotherapy used on left foot for three plantar warts. Pt tolerated well.  ? ?BP 107/61   Pulse 73   Temp 98.1 ?F (36.7 ?C) (Oral)   Ht 5' 3"  (1.6 m)   Wt 225 lb 6.4 oz (102.2 kg)   SpO2 94%   BMI 39.93 kg/m?  ? ? ?   ?Assessment & Plan:  ?Kim Reed comes in today with chief complaint of Annual Exam ? ? ?Diagnosis and orders addressed: ? ?1. Annual physical exam ?- CMP14+EGFR ?- CBC with Differential/Platelet ?- Lipid panel ?- Vitamin B12 ?- TSH ? ?2. Plantar wart ?If does improve, can use compound W and duct tape ?- CMP14+EGFR ?- CBC with Differential/Platelet ? ?3. Vitamin B 12 deficiency ?-  CMP14+EGFR ?- CBC with Differential/Platelet ?- Vitamin B12 ? ?4. Vitamin D deficiency ? ?5. Other fatigue ?- Lyme Disease Serology w/Reflex ?- Rocky mtn spotted fvr abs pnl(IgG+IgM) ?- Alpha-Gal Panel ? ?6. Tick bite of right lower leg, initial encounter ?- Lyme Disease Serology w/Reflex ?- Rocky mtn spotted fvr abs pnl(IgG+IgM) ?- Alpha-Gal Panel ? ? ?Labs pending ?Health Maintenance reviewed ?Diet and exercise encouraged ? ?Follow up plan: ?1 year  ? ? ?Evelina Dun, FNP ? ? ? ?

## 2021-06-05 NOTE — Addendum Note (Signed)
Addended by: Antonietta Barcelona D on: 06/05/2021 11:26 AM ? ? Modules accepted: Orders ? ?

## 2021-06-08 LAB — ALPHA-GAL PANEL
Allergen Lamb IgE: 0.86 kU/L — AB
Beef IgE: 0.93 kU/L — AB
IgE (Immunoglobulin E), Serum: 299 IU/mL (ref 6–495)
O215-IgE Alpha-Gal: 1.4 kU/L — AB
Pork IgE: 0.72 kU/L — AB

## 2021-06-08 LAB — LYME DISEASE SEROLOGY W/REFLEX: Lyme Total Antibody EIA: NEGATIVE

## 2021-06-08 LAB — CMP14+EGFR
ALT: 17 IU/L (ref 0–32)
AST: 15 IU/L (ref 0–40)
Albumin/Globulin Ratio: 1.4 (ref 1.2–2.2)
Albumin: 4.1 g/dL (ref 3.8–4.9)
Alkaline Phosphatase: 127 IU/L — ABNORMAL HIGH (ref 44–121)
BUN/Creatinine Ratio: 13 (ref 9–23)
BUN: 10 mg/dL (ref 6–24)
Bilirubin Total: 0.4 mg/dL (ref 0.0–1.2)
CO2: 22 mmol/L (ref 20–29)
Calcium: 9.3 mg/dL (ref 8.7–10.2)
Chloride: 105 mmol/L (ref 96–106)
Creatinine, Ser: 0.78 mg/dL (ref 0.57–1.00)
Globulin, Total: 2.9 g/dL (ref 1.5–4.5)
Glucose: 89 mg/dL (ref 70–99)
Potassium: 4.3 mmol/L (ref 3.5–5.2)
Sodium: 142 mmol/L (ref 134–144)
Total Protein: 7 g/dL (ref 6.0–8.5)
eGFR: 91 mL/min/{1.73_m2} (ref >59)

## 2021-06-08 LAB — CBC WITH DIFFERENTIAL/PLATELET
Basophils Absolute: 0 10*3/uL (ref 0.0–0.2)
Basos: 0 %
EOS (ABSOLUTE): 0.1 10*3/uL (ref 0.0–0.4)
Eos: 1 %
Hematocrit: 39 % (ref 34.0–46.6)
Hemoglobin: 13.9 g/dL (ref 11.1–15.9)
Immature Grans (Abs): 0 10*3/uL (ref 0.0–0.1)
Immature Granulocytes: 0 %
Lymphocytes Absolute: 2.2 10*3/uL (ref 0.7–3.1)
Lymphs: 31 %
MCH: 32.1 pg (ref 26.6–33.0)
MCHC: 35.6 g/dL (ref 31.5–35.7)
MCV: 90 fL (ref 79–97)
Monocytes Absolute: 0.7 10*3/uL (ref 0.1–0.9)
Monocytes: 11 %
Neutrophils Absolute: 3.9 10*3/uL (ref 1.4–7.0)
Neutrophils: 57 %
Platelets: 292 10*3/uL (ref 150–450)
RBC: 4.33 x10E6/uL (ref 3.77–5.28)
RDW: 12.3 % (ref 11.7–15.4)
WBC: 7 10*3/uL (ref 3.4–10.8)

## 2021-06-08 LAB — LIPID PANEL
Chol/HDL Ratio: 4.2 ratio (ref 0.0–4.4)
Cholesterol, Total: 191 mg/dL (ref 100–199)
HDL: 45 mg/dL (ref >39)
LDL Chol Calc (NIH): 112 mg/dL — ABNORMAL HIGH (ref 0–99)
Triglycerides: 194 mg/dL — ABNORMAL HIGH (ref 0–149)
VLDL Cholesterol Cal: 34 mg/dL (ref 5–40)

## 2021-06-08 LAB — VITAMIN B12: Vitamin B-12: >2000 pg/mL — ABNORMAL HIGH (ref 232–1245)

## 2021-06-08 LAB — ROCKY MTN SPOTTED FVR ABS PNL(IGG+IGM)
RMSF IgG: POSITIVE — AB
RMSF IgM: 0.35 index (ref 0.00–0.89)

## 2021-06-08 LAB — TSH: TSH: 2.28 u[IU]/mL (ref 0.450–4.500)

## 2021-06-08 LAB — RMSF, IGG, IFA: RMSF, IGG, IFA: 1:64 {titer} — ABNORMAL HIGH

## 2021-06-09 ENCOUNTER — Telehealth: Payer: Self-pay | Admitting: Family

## 2021-06-09 NOTE — Telephone Encounter (Signed)
Pt aware of Alpha Gal Panel showing abnormal and to stay away from Beef, pork and lamb. Pt voiced understanding. ?

## 2021-06-09 NOTE — Telephone Encounter (Signed)
Patient calling to check on results for Alpha-Gal Panel. Please call back.  ?

## 2021-06-21 ENCOUNTER — Telehealth: Payer: Self-pay | Admitting: Family

## 2021-06-21 NOTE — Telephone Encounter (Signed)
Pt is asking for a new rx for epipen to have on hand due to alpha gal. Use [employer] [facility]. ?Pt aware [facility] off till Friday ?

## 2021-06-23 MED ORDER — EPINEPHRINE 0.3 MG/0.3ML IJ SOAJ: 0.3000 mg | INTRAMUSCULAR | 1 refills | Status: AC | PRN

## 2021-06-23 NOTE — Telephone Encounter (Signed)
Prescription sent to pharmacy.

## 2021-08-09 ENCOUNTER — Ambulatory Visit: Payer: No Typology Code available for payment source

## 2021-11-28 ENCOUNTER — Telehealth: Payer: Self-pay | Admitting: Family

## 2021-11-28 NOTE — Telephone Encounter (Signed)
Left detailed message per message patient can schedule for 2nd shingrix vaccine

## 2022-06-07 ENCOUNTER — Encounter: Payer: No Typology Code available for payment source | Admitting: Family

## 2023-12-11 ENCOUNTER — Encounter (INDEPENDENT_AMBULATORY_CARE_PROVIDER_SITE_OTHER): Payer: Self-pay | Admitting: *Deleted
